# Patient Record
Sex: Female | Born: 1980
Health system: Southern US, Community
[De-identification: ages and names within clinical notes are randomized; demographics above are authoritative.]

## PROBLEM LIST (undated history)

## (undated) ENCOUNTER — Inpatient Hospital Stay (HOSPITAL_COMMUNITY): Payer: Self-pay

## (undated) DIAGNOSIS — R002 Palpitations: Secondary | ICD-10-CM

## (undated) DIAGNOSIS — M549 Dorsalgia, unspecified: Secondary | ICD-10-CM

## (undated) DIAGNOSIS — R600 Localized edema: Secondary | ICD-10-CM

## (undated) DIAGNOSIS — R0602 Shortness of breath: Secondary | ICD-10-CM

## (undated) DIAGNOSIS — K5909 Other constipation: Secondary | ICD-10-CM

## (undated) DIAGNOSIS — O09519 Supervision of elderly primigravida, unspecified trimester: Secondary | ICD-10-CM

## (undated) DIAGNOSIS — E559 Vitamin D deficiency, unspecified: Secondary | ICD-10-CM

## (undated) DIAGNOSIS — E538 Deficiency of other specified B group vitamins: Secondary | ICD-10-CM

## (undated) DIAGNOSIS — R5383 Other fatigue: Secondary | ICD-10-CM

## (undated) DIAGNOSIS — F32A Depression, unspecified: Secondary | ICD-10-CM

## (undated) DIAGNOSIS — Z9882 Breast implant status: Secondary | ICD-10-CM

## (undated) DIAGNOSIS — K5900 Constipation, unspecified: Secondary | ICD-10-CM

## (undated) DIAGNOSIS — N83201 Unspecified ovarian cyst, right side: Secondary | ICD-10-CM

## (undated) DIAGNOSIS — D649 Anemia, unspecified: Secondary | ICD-10-CM

## (undated) DIAGNOSIS — N809 Endometriosis, unspecified: Secondary | ICD-10-CM

## (undated) DIAGNOSIS — F419 Anxiety disorder, unspecified: Secondary | ICD-10-CM

## (undated) HISTORY — DX: Vitamin D deficiency, unspecified: E55.9

## (undated) HISTORY — DX: Shortness of breath: R06.02

## (undated) HISTORY — DX: Other fatigue: R53.83

## (undated) HISTORY — DX: Endometriosis, unspecified: N80.9

## (undated) HISTORY — PX: AUGMENTATION MAMMAPLASTY: SUR837

## (undated) HISTORY — DX: Anxiety disorder, unspecified: F41.9

## (undated) HISTORY — DX: Dorsalgia, unspecified: M54.9

## (undated) HISTORY — DX: Deficiency of other specified B group vitamins: E53.8

## (undated) HISTORY — DX: Constipation, unspecified: K59.00

## (undated) HISTORY — DX: Anemia, unspecified: D64.9

## (undated) HISTORY — PX: BREAST IMPLANT REMOVAL: SUR1101

## (undated) HISTORY — DX: Localized edema: R60.0

## (undated) HISTORY — DX: Palpitations: R00.2

## (undated) HISTORY — DX: Depression, unspecified: F32.A

## (undated) HISTORY — PX: REDUCTION MAMMAPLASTY: SUR839

---
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
## 2005-07-20 DIAGNOSIS — Z9882 Breast implant status: Secondary | ICD-10-CM

## 2005-07-20 HISTORY — DX: Breast implant status: Z98.82

## 2017-06-08 ENCOUNTER — Encounter: Payer: Self-pay | Admitting: Obstetrics and Gynecology

## 2017-08-26 ENCOUNTER — Other Ambulatory Visit: Payer: Self-pay

## 2017-08-26 ENCOUNTER — Ambulatory Visit: Payer: BLUE CROSS/BLUE SHIELD | Admitting: Obstetrics and Gynecology

## 2017-08-26 ENCOUNTER — Encounter: Payer: Self-pay | Admitting: Obstetrics and Gynecology

## 2017-08-26 VITALS — BP 108/62 | HR 66 | Resp 18 | Ht 66.0 in | Wt 157.0 lb

## 2017-08-26 DIAGNOSIS — N83201 Unspecified ovarian cyst, right side: Secondary | ICD-10-CM

## 2017-08-26 DIAGNOSIS — M79604 Pain in right leg: Secondary | ICD-10-CM

## 2017-08-26 DIAGNOSIS — M545 Low back pain: Secondary | ICD-10-CM

## 2017-08-26 NOTE — Progress Notes (Signed)
Scheduled patient while in office for PUS with Dr.Silva on 09/02/2017 at 9:30 am with 10 am consult. Patient is agreeable to date and time.

## 2017-08-26 NOTE — Progress Notes (Signed)
37 y.o. G0P0000 Married Caucasian/[place]ian female here for lower back pain and to establish care. Patient has been told she has endometriosis but has not had diagnostic testing   Began right lumbar pain close to menstrual period in 2016.  Had Mirena placed in 2016 and felt like the pain worsened.  Did an ultrasound in [place] 2018 which showed 27 mm hemorrhagic corpus luteum cyst of the right ovary.  IUD was in a good position.  Had a laparoscopic surgery scheduled for November 2018 but did not proceed due to her recent wedding and plans to move to the [place].  Has not done an evaluation of the lower spine.  Exercise can provoke the pain.  Larey Seat down her stairs at home 10 years ago. No recent trauma.   Feels bloated.   Menses are monthly and very light.  Last 4 days.   No dysuria or hematuria.  2 UTIs after IUD placed.   No pain with bowel movement or blood in the stool. No black stool.  Considering future childbearing.   Recently married.  Architect.   PCP:   None.   Patient's last menstrual period was 08/17/2017 (exact date).     Period Cycle (Days): (only occ. spotting with Mirena IUD)     Sexually active: Yes.    The current method of family planning is IUD.   Placed Oct. 2016.  Exercising: No.   Smoker:  no  Health Maintenance: Pap:  07/2017 normal per patient. History of abnormal Pap:  no MMG:  12/2016 ultrasound in [place]--normal.  Had a known nodule of the right breast that is being monitored.  Colonoscopy:  n/a BMD:   n/a  Result  n/a TDaP:  unsure Gardasil:   unsure HIV: Neg Hep C:Neg   reports that  has never smoked. she has never used smokeless tobacco. She reports that she drinks about 1.2 oz of alcohol per week. She reports that she does not use drugs.  History reviewed. No pertinent past medical history.  Past Surgical History:  Procedure Laterality Date  . AUGMENTATION MAMMAPLASTY     Silicone implants--[place]    No current outpatient medications  on file.   No current facility-administered medications for this visit.     Family History  Problem Relation Age of Onset  . Diabetes Paternal Grandmother     ROS:  Pertinent items are noted in HPI.  Otherwise, a comprehensive ROS was negative.  Exam:   BP 108/62 (BP Location: Right Arm, Patient Position: Sitting, Cuff Size: Normal)   Pulse 66   Resp 18   Ht 5\' 6"  (1.676 m)   Wt 157 lb (71.2 kg)   LMP 08/17/2017 (Exact Date)   BMI 25.34 kg/m     General appearance: alert, cooperative and appears stated age Head: Normocephalic, without obvious abnormality, atraumatic Neck: no adenopathy, supple, symmetrical, trachea midline and thyroid normal to inspection and palpation Lungs: clear to auscultation bilaterally Heart: regular rate and rhythm Abdomen: soft, non-tender; no masses, no organomegaly Extremities: extremities normal, atraumatic, no cyanosis or edema Skin: Skin color, texture, turgor normal. No rashes or lesions No abnormal inguinal nodes palpated Neurologic: Grossly normal  Pelvic: External genitalia:  no lesions              Urethra:  normal appearing urethra with no masses, tenderness or lesions              Bartholins and Skenes: normal  Vagina: normal appearing vagina with normal color and discharge, no lesions              Cervix: no lesions.  IUD strings noted.  No CMT.             Bimanual Exam:  Uterus:  normal size, contour, position, consistency, mobility, non-tender              Adnexa: no mass, fullness, tenderness.  Right ovary slightly larger than left ovary.  Nontender.              Rectal exam: Yes.  .  Confirms.              Anus:  normal sphincter tone, no lesions  Chaperone was present for exam.  Assessment:   Right lumbar pain with sciatica, perimenstrual. Right ovarian cyst on ultrasound.   Plan:   Discussion of endometriosis, fertility, and possible surgical intervention.  I discussed AMA status.  Return for pelvic  ultrasound.  Continue with Mirena.  I am recommending she also establish care with PCP for eval of lumbar pain with sciatica.  Dr. Leola [place] Aguiar.    After visit summary provided.   ___45____ minutes face to face time of which over 50% was spent in counseling.

## 2017-08-26 NOTE — Patient Instructions (Signed)
Consider Dr. Bernadette Hoitafael[place] [facility] as a new family doctor.

## 2017-08-30 ENCOUNTER — Telehealth: Payer: Self-pay | Admitting: Obstetrics and Gynecology

## 2017-08-30 NOTE — Telephone Encounter (Signed)
Spoke with patients spouse Rulon SeraLeandro, ok per current dpr, requesting to reschedule PUS to a later time. PUS rescheduled for 09/02/17 at 10:30am with consult at 11am with Dr. Edward JollySilva. Spouse is agreeable to date and time.   Routing to provider for final review.  Will close encounter.  Cc: Harland DingwallSuzy Dixon

## 2017-08-30 NOTE — Telephone Encounter (Signed)
Patient's spouse Rulon SeraLeandro is calling  to reschedule his wife's pus appointment. D[facility] on fie to [facility] with [facility].

## 2017-09-02 ENCOUNTER — Encounter: Payer: Self-pay | Admitting: Obstetrics and Gynecology

## 2017-09-02 ENCOUNTER — Ambulatory Visit (INDEPENDENT_AMBULATORY_CARE_PROVIDER_SITE_OTHER): Payer: BLUE CROSS/BLUE SHIELD

## 2017-09-02 ENCOUNTER — Ambulatory Visit: Payer: BLUE CROSS/BLUE SHIELD | Admitting: Obstetrics and Gynecology

## 2017-09-02 ENCOUNTER — Other Ambulatory Visit: Payer: BLUE CROSS/BLUE SHIELD | Admitting: Obstetrics and Gynecology

## 2017-09-02 ENCOUNTER — Other Ambulatory Visit: Payer: BLUE CROSS/BLUE SHIELD

## 2017-09-02 VITALS — BP 114/66 | HR 70 | Ht 66.0 in | Wt 157.0 lb

## 2017-09-02 DIAGNOSIS — N83202 Unspecified ovarian cyst, left side: Secondary | ICD-10-CM | POA: Diagnosis not present

## 2017-09-02 DIAGNOSIS — R102 Pelvic and perineal pain: Secondary | ICD-10-CM | POA: Diagnosis not present

## 2017-09-02 DIAGNOSIS — M545 Low back pain, unspecified: Secondary | ICD-10-CM

## 2017-09-02 DIAGNOSIS — N83201 Unspecified ovarian cyst, right side: Secondary | ICD-10-CM

## 2017-09-02 DIAGNOSIS — Z87898 Personal history of other specified conditions: Secondary | ICD-10-CM

## 2017-09-02 DIAGNOSIS — M79604 Pain in right leg: Secondary | ICD-10-CM

## 2017-09-02 MED ORDER — NORETHINDRONE 0.35 MG PO TABS: 1.0000 | ORAL_TABLET | Freq: Every day | ORAL | 0 refills | Status: DC

## 2017-09-02 NOTE — Progress Notes (Signed)
GYNECOLOGY  VISIT   HPI: 37 y.o.   Married  Caucasian/[place]ian  female   G0P0000 with Patient's last menstrual period was 08/17/2017 (exact date).   here for pelvic ultrasound for Rt.ovarian cyst follow up.   Husband present for the visit today.   Has lumbar pain and extends down the back of right leg during her cycle. Has cramping.   Prior US in [place] showing a hemorrhagic right ovarian cyst.  Has Mirena IUD.  Stopped using combined oral contraception for reasons of edema.  This continued after she stopped combined OCPs. States she did not do well with [facility] in [place] which was a 30 mcg estradiol pill. Did better with a 20 mcg estradiol pill.  Nonsmoker, no HTN, No liver disease, No DVT/PE or family history of this, no hx migraine HAs.  No PCP visit to date.  GYNECOLOGIC HISTORY: Patient's last menstrual period was 08/17/2017 (exact date). Contraception:  Mirena 04/2015 Menopausal hormone therapy:  none Last mammogram:  12/2016 ultrasound in [place]--normal.  Had a known nodule of the right breast that is being monitored.  Last pap smear:   07/2017 normal per patient.        OB History    Gravida Para Term Preterm AB Living   0 0 0 0 0 0   SAB TAB Ectopic Multiple Live Births   0 0 0 0 0         There are no active problems to display for this patient.   No past medical history on file.  Past Surgical History:  Procedure Laterality Date  . AUGMENTATION MAMMAPLASTY     Silicone implants--[place]    No current outpatient medications on file.   No current facility-administered medications for this visit.      ALLERGIES: Patient has no known allergies.  Family History  Problem Relation Age of Onset  . Diabetes Paternal Grandmother     Social History   Socioeconomic History  . Marital status: Married    Spouse name: Not on file  . Number of children: Not on file  . Years of education: Not on file  . Highest education level: Not on file  Social Needs   . Financial resource strain: Not on file  . Food insecurity - worry: Not on file  . Food insecurity - inability: Not on file  . Transportation needs - medical: Not on file  . Transportation needs - non-medical: Not on file  Occupational History  . Not on file  Tobacco Use  . Smoking status: Never Smoker  . Smokeless tobacco: Never Used  Substance and Sexual Activity  . Alcohol use: Yes    Alcohol/week: 1.2 oz    Types: 2 Glasses of wine per week  . Drug use: No  . Sexual activity: Yes    Birth control/protection: IUD    Comment: Mirena inseted 04/2015  Other Topics Concern  . Not on file  Social History Narrative  . Not on file    ROS:  Pertinent items are noted in HPI.  PHYSICAL EXAMINATION:    BP 114/66 (BP Location: Left Arm, Patient Position: Sitting, Cuff Size: Normal)   Pulse 70   Ht 5\' 6"  (1.676 m)   Wt 157 lb (71.2 kg)   LMP 08/17/2017 (Exact Date)   BMI 25.34 kg/m     General appearance: alert, cooperative and appears stated age  Pelvic US: Uterus with no masses.  IUD in endometrial canal. EMS 3.47 mm.  Right ovary with  16 mm follicle with calcificaitons.  Left ovary with 46 x 34 x 44 mm hemorrhagic cyst.   No free fluid.  ASSESSMENT  Lumbar/pelvic pain.  Left ovarian cyst.  Mirena IUD.  Right breast nodule.  Bilateral breast implants.    PLAN  Discussion of pelvic pain and options for treatment:  COCs or POPs, [place], Depo Lupron, laparoscopy.  Will proceed with Micronor for 3 month trial.  Discussed risks and benefits. Follow up of left ovarian cyst in 6 weeks.  Schedule bilateral dx mammogram and right breast US.   An After Visit Summary was printed and given to the patient.  __25___ minutes face to face time of which over 50% was spent in counseling.

## 2017-09-02 NOTE — Patient Instructions (Signed)
Norethindrone tablets (contraception) What is this medicine? NORETHINDRONE (nor eth IN drone) is an oral contraceptive. The product contains a female hormone known as a progestin. It is used to prevent pregnancy. This medicine may be used for other purposes; ask your health care provider or pharmacist if you have questions. COMMON BRAND NAME(S): Camila, Deblitane 28-Day, Errin, Heather, Jencycla, Jolivette, Lyza, Nor-QD, Nora-BE, Norlyroc, Ortho Micronor, Sharobel 28-Day What should I tell my health care provider before I take this medicine? They need to know if you have any of these conditions: -blood vessel disease or blood clots -breast, cervical, or vaginal cancer -diabetes -heart disease -kidney disease -liver disease -mental depression -migraine -seizures -stroke -vaginal bleeding -an unusual or allergic reaction to norethindrone, other medicines, foods, dyes, or preservatives -pregnant or trying to get pregnant -breast-feeding How should I use this medicine? Take this medicine by mouth with a glass of water. You may take it with or without food. Follow the directions on the prescription label. Take this medicine at the same time each day and in the order directed on the package. Do not take your medicine more often than directed. Contact your pediatrician regarding the use of this medicine in children. Special care may be needed. This medicine has been used in female children who have started having menstrual periods. A patient package insert for the product will be given with each prescription and refill. Read this sheet carefully each time. The sheet may change frequently. Overdosage: If you think you have taken too much of this medicine contact a poison control center or emergency room at once. NOTE: This medicine is only for you. Do not share this medicine with others. What if I miss a dose? Try not to miss a dose. Every time you miss a dose or take a dose late your chance of  pregnancy increases. When 1 pill is missed (even if only 3 hours late), take the missed pill as soon as possible and continue taking a pill each day at the regular time (use a back up method of birth control for the next 48 hours). If more than 1 dose is missed, use an additional birth control method for the rest of your pill pack until menses occurs. Contact your health care professional if more than 1 dose has been missed. What may interact with this medicine? Do not take this medicine with any of the following medications: -amprenavir or fosamprenavir -bosentan This medicine may also interact with the following medications: -antibiotics or medicines for infections, especially rifampin, rifabutin, rifapentine, and griseofulvin, and possibly penicillins or tetracyclines -aprepitant -barbiturate medicines, such as phenobarbital -carbamazepine -felbamate -modafinil -oxcarbazepine -phenytoin -ritonavir or other medicines for HIV infection or AIDS -St. John's wort -topiramate This list may not describe all possible interactions. Give your health care provider a list of all the medicines, herbs, non-prescription drugs, or dietary supplements you use. Also tell them if you smoke, drink alcohol, or use illegal drugs. Some items may interact with your medicine. What should I watch for while using this medicine? Visit your doctor or health care professional for regular checks on your progress. You will need a regular breast and pelvic exam and Pap smear while on this medicine. Use an additional method of birth control during the first cycle that you take these tablets. If you have any reason to think you are pregnant, stop taking this medicine right away and contact your doctor or health care professional. If you are taking this medicine for hormone related problems, it   may take several cycles of use to see improvement in your condition. This medicine does not protect you against HIV infection (AIDS)  or any other sexually transmitted diseases. What side effects may I notice from receiving this medicine? Side effects that you should report to your doctor or health care professional as soon as possible: -breast tenderness or discharge -pain in the abdomen, chest, groin or leg -severe headache -skin rash, itching, or hives -sudden shortness of breath -unusually weak or tired -vision or speech problems -yellowing of skin or eyes Side effects that usually do not require medical attention (report to your doctor or health care professional if they continue or are bothersome): -changes in sexual desire -change in menstrual flow -facial hair growth -fluid retention and swelling -headache -irritability -nausea -weight gain or loss This list may not describe all possible side effects. Call your doctor for medical advice about side effects. You may report side effects to FDA at 1-800-FDA-1088. Where should I keep my medicine? Keep out of the reach of children. Store at room temperature between 15 and 30 degrees C (59 and 86 degrees F). Throw away any unused medicine after the expiration date. NOTE: This sheet is a summary. It may not cover all possible information. If you have questions about this medicine, talk to your doctor, pharmacist, or health care provider.  2018 Elsevier/Gold Standard (2012-03-25 16:41:35)  

## 2017-09-02 NOTE — Progress Notes (Signed)
Encounter reviewed by Dr. Keniesha Adderly Amundson C. Silva.  

## 2017-09-02 NOTE — Progress Notes (Signed)
Scheduled patient while in office for bilateral diagnostic mammogram with right breast ultrasound on 09/30/2017 at 3:[facility]. Patient is agreeable to date and time. Placed in mammogram hold. Patient scheduled for 3 month follow up PUS on 12/09/2017 at 2 pm with 2:30 pm consult with Dr.Silva.

## 2017-09-30 ENCOUNTER — Ambulatory Visit
Admission: RE | Admit: 2017-09-30 | Discharge: 2017-09-30 | Disposition: A | Discharge status: Home / Self Care | Payer: BLUE CROSS/BLUE SHIELD | Source: Ambulatory Visit | Attending: Obstetrics and Gynecology | Admitting: Obstetrics and Gynecology

## 2017-09-30 ENCOUNTER — Other Ambulatory Visit: Payer: Self-pay | Admitting: Obstetrics and Gynecology

## 2017-09-30 DIAGNOSIS — N6310 Unspecified lump in the right breast, unspecified quadrant: Secondary | ICD-10-CM | POA: Diagnosis not present

## 2017-09-30 DIAGNOSIS — Z87898 Personal history of other specified conditions: Secondary | ICD-10-CM

## 2017-09-30 DIAGNOSIS — N6002 Solitary cyst of left breast: Secondary | ICD-10-CM

## 2017-09-30 DIAGNOSIS — R922 Inconclusive mammogram: Secondary | ICD-10-CM | POA: Diagnosis not present

## 2017-09-30 DIAGNOSIS — N6320 Unspecified lump in the left breast, unspecified quadrant: Secondary | ICD-10-CM | POA: Diagnosis not present

## 2017-10-06 ENCOUNTER — Other Ambulatory Visit: Payer: Self-pay | Admitting: Obstetrics and Gynecology

## 2017-10-06 DIAGNOSIS — N6310 Unspecified lump in the right breast, unspecified quadrant: Secondary | ICD-10-CM

## 2017-10-06 DIAGNOSIS — N6489 Other specified disorders of breast: Secondary | ICD-10-CM

## 2017-11-22 ENCOUNTER — Encounter: Payer: Self-pay | Admitting: Obstetrics and Gynecology

## 2017-11-22 ENCOUNTER — Other Ambulatory Visit: Payer: Self-pay

## 2017-11-22 ENCOUNTER — Ambulatory Visit: Payer: BLUE CROSS/BLUE SHIELD | Admitting: Obstetrics and Gynecology

## 2017-11-22 VITALS — BP 110/60 | HR 76 | Resp 16 | Ht 66.0 in | Wt 161.0 lb

## 2017-11-22 DIAGNOSIS — N83202 Unspecified ovarian cyst, left side: Secondary | ICD-10-CM | POA: Diagnosis not present

## 2017-11-22 DIAGNOSIS — R102 Pelvic and perineal pain: Secondary | ICD-10-CM | POA: Diagnosis not present

## 2017-11-22 MED ORDER — NORETHINDRONE 0.35 MG PO TABS: 1.0000 | ORAL_TABLET | Freq: Every day | ORAL | 2 refills | Status: DC

## 2017-11-22 NOTE — Progress Notes (Signed)
GYNECOLOGY  VISIT   HPI: 37 y.o.   Married  [place]/Caucasian  female   G0P0000 with No LMP recorded (lmp unknown).   here for medication f/u.  Hx bilateral ovarian cysts.  Pelvic US on 09/02/17 showing hemorrhagic left ovarian cyst.  Previously had hemorrhagic right ovarian cyst in [place].  Has Mirena IUD.  I added Micronor for 3 month trial.  Much less cramping.  States she is much better.  A little acne for the last month.  Feels more sensitive emotionally.   Would like to take out Mirena IUD in December when she would like to try for pregnancy.   GYNECOLOGIC HISTORY: No LMP recorded (lmp unknown). Contraception:  Mirena IUD inserted 04/2015 Menopausal hormone therapy:  none Last mammogram:  12/2016 ultrasound in [place] -- normal. Had a known nodule of the right breast that is being monitored. Last pap smear:   07/2017 normal per patient        OB History    Gravida  0   Para  0   Term  0   Preterm  0   AB  0   Living  0     SAB  0   TAB  0   Ectopic  0   Multiple  0   Live Births  0              Patient Active Problem List   Diagnosis Date Noted  . Left ovarian cyst 09/02/2017    No past medical history on file.  Past Surgical History:  Procedure Laterality Date  . AUGMENTATION MAMMAPLASTY     Silicone implants--[place]    Current Outpatient Medications  Medication Sig Dispense Refill  . levonorgestrel (MIRENA) 20 MCG/24HR IUD 1 each by Intrauterine route once.    . norethindrone (MICRONOR,CAMILA,ERRIN) 0.35 MG tablet Take 1 tablet (0.35 mg total) by mouth daily. 3 Package 0   No current facility-administered medications for this visit.      ALLERGIES: Patient has no known allergies.  Family History  Problem Relation Age of Onset  . Diabetes Paternal Grandmother     Social History   Socioeconomic History  . Marital status: Married    Spouse name: Not on file  . Number of children: Not on file  . Years of education: Not on  file  . Highest education level: Not on file  Occupational History  . Not on file  Social Needs  . Financial resource strain: Not on file  . Food insecurity:    Worry: Not on file    Inability: Not on file  . Transportation needs:    Medical: Not on file    Non-medical: Not on file  Tobacco Use  . Smoking status: Never Smoker  . Smokeless tobacco: Never Used  Substance and Sexual Activity  . Alcohol use: Yes    Alcohol/week: 1.2 oz    Types: 2 Glasses of wine per week  . Drug use: No  . Sexual activity: Yes    Birth control/protection: IUD    Comment: Mirena inseted 04/2015  Lifestyle  . Physical activity:    Days per week: Not on file    Minutes per session: Not on file  . Stress: Not on file  Relationships  . Social connections:    Talks on phone: Not on file    Gets together: Not on file    Attends religious service: Not on file    Active member of club or organization: Not on  file    Attends meetings of clubs or organizations: Not on file    Relationship status: Not on file  . Intimate partner violence:    Fear of current or ex partner: Not on file    Emotionally abused: Not on file    Physically abused: Not on file    Forced sexual activity: Not on file  Other Topics Concern  . Not on file  Social History Narrative  . Not on file    Review of Systems  Constitutional: Negative.   HENT: Negative.   Eyes: Negative.   Respiratory: Negative.   Cardiovascular: Negative.   Gastrointestinal: Negative.   Endocrine: Negative.   Genitourinary: Negative.   Musculoskeletal: Negative.   Skin: Negative.   Allergic/Immunologic: Negative.   Neurological: Negative.   Hematological: Negative.   Psychiatric/Behavioral: Negative.     PHYSICAL EXAMINATION:    BP 110/60 (BP Location: Right Arm, Patient Position: Sitting, Cuff Size: Normal)   Pulse 76   Resp 16   Ht  (1.676 m)   Wt 161 lb (73 kg)   LMP  (LMP Unknown)   BMI 25.99 kg/m     General appearance:  alert, cooperative and appears stated age   ASSESSMENT  Pelvic pain improved.  On Micronor and has Mirena IUD.  Hemorrhagic left ovarian cyst.   PLAN  We will continue with her Micronor and Mirena until December so she has good pain relief and good pregnancy prevention.  Follow up pelvic ultrasound scheduled for this month.  IUD removal at the end of the year.   An After Visit Summary was printed and given to the patient.  __15____ minutes face to face time of which over 50% was spent in counseling.

## 2017-12-09 ENCOUNTER — Ambulatory Visit: Payer: BLUE CROSS/BLUE SHIELD | Admitting: Obstetrics and Gynecology

## 2017-12-09 ENCOUNTER — Ambulatory Visit (INDEPENDENT_AMBULATORY_CARE_PROVIDER_SITE_OTHER): Payer: BLUE CROSS/BLUE SHIELD

## 2017-12-09 ENCOUNTER — Other Ambulatory Visit: Payer: Self-pay

## 2017-12-09 ENCOUNTER — Encounter: Payer: Self-pay | Admitting: Obstetrics and Gynecology

## 2017-12-09 VITALS — BP 110/66 | HR 76 | Resp 16 | Ht 66.0 in | Wt 162.0 lb

## 2017-12-09 DIAGNOSIS — Z3169 Encounter for other general counseling and advice on procreation: Secondary | ICD-10-CM | POA: Diagnosis not present

## 2017-12-09 DIAGNOSIS — N83201 Unspecified ovarian cyst, right side: Secondary | ICD-10-CM | POA: Diagnosis not present

## 2017-12-09 DIAGNOSIS — N83202 Unspecified ovarian cyst, left side: Secondary | ICD-10-CM | POA: Diagnosis not present

## 2017-12-09 NOTE — Patient Instructions (Addendum)
Try the book What to Expect When Trying for Pregnancy.   Ovarian Cyst An ovarian cyst is a fluid-filled sac that forms on an ovary. The ovaries are small organs that produce eggs in women. Various types of cysts can form on the ovaries. Some may cause symptoms and require treatment. Most ovarian cysts go away on their own, are not cancerous (are benign), and do not cause problems. Common types of ovarian cysts include:  Functional (follicle) cysts. ? Occur during the menstrual cycle, and usually go away with the next menstrual cycle if you do not get pregnant. ? Usually cause no symptoms.  Endometriomas. ? Are cysts that form from the tissue that lines the uterus (endometrium). ? Are sometimes called "chocolate cysts" because they become filled with blood that turns brown. ? Can cause pain in the lower abdomen during intercourse and during your period.  Cystadenoma cysts. ? Develop from cells on the outside surface of the ovary. ? Can get very large and cause lower abdomen pain and pain with intercourse. ? Can cause severe pain if they twist or break open (rupture).  Dermoid cysts. ? Are sometimes found in both ovaries. ? May contain different kinds of body tissue, such as skin, teeth, hair, or cartilage. ? Usually do not cause symptoms unless they get very big.  Theca lutein cysts. ? Occur when too much of a certain hormone (human chorionic gonadotropin) is produced and overstimulates the ovaries to produce an egg. ? Are most common after having procedures used to assist with the conception of a baby (in vitro fertilization).  What are the causes? Ovarian cysts may be caused by:  Ovarian hyperstimulation syndrome. This is a condition that can develop from taking fertility medicines. It causes multiple large ovarian cysts to form.  Polycystic ovarian syndrome (PCOS). This is a common hormonal disorder that can cause ovarian cysts, as well as problems with your period or  fertility.  What increases the risk? The following factors may make you more likely to develop ovarian cysts:  Being overweight or obese.  Taking fertility medicines.  Taking certain forms of hormonal birth control.  Smoking.  What are the signs or symptoms? Many ovarian cysts do not cause symptoms. If symptoms are present, they may include:  Pelvic pain or pressure.  Pain in the lower abdomen.  Pain during sex.  Abdominal swelling.  Abnormal menstrual periods.  Increasing pain with menstrual periods.  How is this diagnosed? These cysts are commonly found during a routine pelvic exam. You may have tests to find out more about the cyst, such as:  Ultrasound.  X-ray of the pelvis.  CT scan.  MRI.  Blood tests.  How is this treated? Many ovarian cysts go away on their own without treatment. Your health care provider may want to check your cyst regularly for 2-3 months to see if it changes. If you are in menopause, it is especially important to have your cyst monitored closely because menopausal women have a higher rate of ovarian cancer. When treatment is needed, it may include:  Medicines to help relieve pain.  A procedure to drain the cyst (aspiration).  Surgery to remove the whole cyst.  Hormone treatment or birth control pills. These methods are sometimes used to help dissolve a cyst.  Follow these instructions at home:  Take over-the-counter and prescription medicines only as told by your health care provider.  Do not drive or use heavy machinery while taking prescription pain medicine.  Get regular pelvic  exams and Pap tests as often as told by your health care provider.  Return to your normal activities as told by your health care provider. Ask your health care provider what activities are safe for you.  Do not use any products that contain nicotine or tobacco, such as cigarettes and e-cigarettes. If you need help quitting, ask your health care  provider.  Keep all follow-up visits as told by your health care provider. This is important. Contact a health care provider if:  Your periods are late, irregular, or painful, or they stop.  You have pelvic pain that does not go away.  You have pressure on your bladder or trouble emptying your bladder completely.  You have pain during sex.  You have any of the following in your abdomen: ? A feeling of fullness. ? Pressure. ? Discomfort. ? Pain that does not go away. ? Swelling.  You feel generally ill.  You become constipated.  You lose your appetite.  You develop severe acne.  You start to have more body hair and facial hair.  You are gaining weight or losing weight without changing your exercise and eating habits.  You think you may be pregnant. Get help right away if:  You have abdominal pain that is severe or gets worse.  You cannot eat or drink without vomiting.  You suddenly develop a fever.  Your menstrual period is much heavier than usual. This information is not intended to replace advice given to you by your health care provider. Make sure you discuss any questions you have with your health care provider. Document Released: 07/06/2005 Document Revised: 01/24/2016 Document Reviewed: 12/08/2015 Elsevier Interactive Patient Education  [employer].

## 2017-12-09 NOTE — Progress Notes (Signed)
GYNECOLOGY  VISIT   HPI: 37 y.o.   Married  [place]  female   G0P0000 with Patient's last menstrual period was 11/18/2017.   here for ultrasound recheck of ovaries.  Still has some right lower abdominal pain.   Hx ovarian cysts.  Prior US in [place] showed a hemorrhagic right ovarian cyst.  [place] 09/02/17 here had left ovary with 46 x 34 x 44 mm echogenic cyst with debris consistent with hemorrhagic cyst.  Right ovary with follicle and small calcifications.  IUD in endometrial canal. No free fluid.  On Micronor and Mirena IUD until December when she want to try for pregnancy.  Forgot a pill this month, and had pain.   Thinking about pursing childbearing more in the future and not at the end of this year.  Considering professional goals.  GYNECOLOGIC HISTORY: Patient's last menstrual period was 11/18/2017. Contraception:  Mirena IUD inserted 04/2015 Menopausal hormone therapy:  none Last mammogram:  09/30/17 Bilateral MM/Right and Left [place] -- BIRADS 3:Probably benign/density c Last pap smear:   07/2017 normal per patient        OB History    Gravida  0   Para  0   Term  0   Preterm  0   AB  0   Living  0     SAB  0   TAB  0   Ectopic  0   Multiple  0   Live Births  0              Patient Active Problem List   Diagnosis Date Noted  . Left ovarian cyst 09/02/2017    History reviewed. No pertinent past medical history.  Past Surgical History:  Procedure Laterality Date  . AUGMENTATION MAMMAPLASTY     Silicone implants--[place]    Current Outpatient Medications  Medication Sig Dispense Refill  . levonorgestrel (MIRENA) 20 MCG/24HR IUD 1 each by Intrauterine route once.    . norethindrone (MICRONOR,CAMILA,ERRIN) 0.35 MG tablet Take 1 tablet (0.35 mg total) by mouth daily. 3 Package 2   No current facility-administered medications for this visit.      ALLERGIES: Patient has no known allergies.  Family History  Problem Relation Age of Onset   . Diabetes Paternal Grandmother     Social History   Socioeconomic History  . Marital status: Married    Spouse name: Not on file  . Number of children: Not on file  . Years of education: Not on file  . Highest education level: Not on file  Occupational History  . Not on file  Social Needs  . Financial resource strain: Not on file  . Food insecurity:    Worry: Not on file    Inability: Not on file  . Transportation needs:    Medical: Not on file    Non-medical: Not on file  Tobacco Use  . Smoking status: Never Smoker  . Smokeless tobacco: Never Used  Substance and Sexual Activity  . Alcohol use: Yes    Alcohol/week: 1.2 oz    Types: 2 Glasses of wine per week  . Drug use: No  . Sexual activity: Yes    Birth control/protection: IUD, Pill    Comment: Mirena inseted 04/2015  Lifestyle  . Physical activity:    Days per week: Not on file    Minutes per session: Not on file  . Stress: Not on file  Relationships  . Social connections:    Talks  on phone: Not on file    Gets together: Not on file    Attends religious service: Not on file    Active member of club or organization: Not on file    Attends meetings of clubs or organizations: Not on file    Relationship status: Not on file  . Intimate partner violence:    Fear of current or ex partner: Not on file    Emotionally abused: Not on file    Physically abused: Not on file    Forced sexual activity: Not on file  Other Topics Concern  . Not on file  Social History Narrative  . Not on file    Review of Systems  Constitutional: Negative.   HENT: Negative.   Eyes: Negative.   Respiratory: Negative.   Cardiovascular: Negative.   Gastrointestinal: Negative.   Endocrine: Negative.   Genitourinary: Negative.   Musculoskeletal: Negative.   Skin: Negative.   Allergic/Immunologic: Negative.   Neurological: Negative.   Hematological: Negative.   Psychiatric/Behavioral: Negative.     PHYSICAL EXAMINATION:     BP 110/66 (BP Location: Left Arm, Patient Position: Sitting, Cuff Size: Normal)   Pulse 76   Resp 16   Ht  (1.676 m)   Wt 162 lb (73.5 kg)   LMP 11/18/2017   BMI 26.15 kg/m     General appearance: alert, cooperative and appears stated age   Pelvic US Uterus normal.  IUD in endometrial canal. EMS 3.46 mm. Right ovary 30 mm thick walled ovarian cyst, echofree and no abnormal blood flow. Left ovary 31 mm thick walled ovarian cyst, echofree and no abnormal blood flow. No free fluid.  ASSESSMENT  Bilateral ovarian cysts.  Left cyst is getting smaller. Mirena IUD.  On POP for pelvic pain control. Preconception counseling.  AMA status.  PLAN  We discussed ovarian cysts.  She certainly has had several come and go over the last several months.  I do not recommend she have a routine scheduled ultrasound, but this can be done at any time for re-evaluation.  We discussed that laparoscopy for evaluation and treatment of pelvic pain and endometriosis is an option if she desires.  This was presented to her in [place] as well prior to her move here.  She wants to continue with the POP and Mirena. We talked about her advanced maternal age and the effects on pregnancy - decreased fertility, increased risk of miscarriage, increased risk of Down's syndrome, gestational diabetes, and hypertension in pregnancy.     An After Visit Summary was printed and given to the patient.  __15____ minutes face to face time of which over 50% was spent in counseling.

## 2017-12-12 DIAGNOSIS — N83201 Unspecified ovarian cyst, right side: Secondary | ICD-10-CM | POA: Insufficient documentation

## 2017-12-12 DIAGNOSIS — N83202 Unspecified ovarian cyst, left side: Principal | ICD-10-CM

## 2018-03-22 ENCOUNTER — Telehealth: Payer: Self-pay | Admitting: Obstetrics and Gynecology

## 2018-03-22 DIAGNOSIS — Z30432 Encounter for removal of intrauterine contraceptive device: Secondary | ICD-10-CM

## 2018-03-22 NOTE — Telephone Encounter (Signed)
Patient would like to talk about getting her Mirena removed.

## 2018-03-22 NOTE — Telephone Encounter (Signed)
Spoke with patient. Patient request to schedule IUD removal, Mirena placed 04/2015, planning for pregnancy. Patient declined consult, discussed at OV 12/09/17.   IUD removal scheduled for 04/07/18 at 2pm with Dr. Edward Jolly, order placed for precert.   Routing to provider for final review. Patient is agreeable to disposition. Will close encounter.   Cc: Harland Dingwall, Soundra Pilon

## 2018-04-07 ENCOUNTER — Ambulatory Visit (INDEPENDENT_AMBULATORY_CARE_PROVIDER_SITE_OTHER): Payer: BLUE CROSS/BLUE SHIELD | Admitting: Obstetrics and Gynecology

## 2018-04-07 ENCOUNTER — Encounter: Payer: Self-pay | Admitting: Obstetrics and Gynecology

## 2018-04-07 VITALS — BP 102/62 | HR 80 | Ht 66.0 in | Wt 165.0 lb

## 2018-04-07 DIAGNOSIS — Z3169 Encounter for other general counseling and advice on procreation: Secondary | ICD-10-CM

## 2018-04-07 DIAGNOSIS — Z30432 Encounter for removal of intrauterine contraceptive device: Secondary | ICD-10-CM

## 2018-04-07 NOTE — Patient Instructions (Signed)
Consider the book series "What to Expect When...Marland KitchenMarland Kitchen"  Prenatal Care WHAT IS PRENATAL CARE? Prenatal care is the process of caring for a pregnant woman before she gives birth. Prenatal care makes sure that she and her baby remain as healthy as possible throughout pregnancy. Prenatal care may be provided by a midwife, family practice health care provider, or a childbirth and pregnancy specialist (obstetrician). Prenatal care may include physical examinations, testing, treatments, and education on nutrition, lifestyle, and social support services. WHY IS PRENATAL CARE SO IMPORTANT? Early and consistent prenatal care increases the chance that you and your baby will remain healthy throughout your pregnancy. This type of care also decreases a baby's risk of being born too early (prematurely), or being born smaller than expected (small for gestational age). Any underlying medical conditions you may have that could pose a risk during your pregnancy are discussed during prenatal care visits. You will also be monitored regularly for any new conditions that may arise during your pregnancy so they can be treated quickly and effectively. WHAT HAPPENS DURING PRENATAL CARE VISITS? Prenatal care visits may include the following: Discussion Tell your health care provider about any new signs or symptoms you have experienced since your last visit. These might include:  Nausea or vomiting.  Increased or decreased level of energy.  Difficulty sleeping.  Back or leg pain.  Weight changes.  Frequent urination.  Shortness of breath with physical activity.  Changes in your skin, such as the development of a rash or itchiness.  Vaginal discharge or bleeding.  Feelings of excitement or nervousness.  Changes in your baby's movements.  You may want to write down any questions or topics you want to discuss with your health care provider and bring them with you to your appointment. Examination During your first  prenatal care visit, you will likely have a complete physical exam. Your health care provider will often examine your vagina, cervix, and the position of your uterus, as well as check your heart, lungs, and other body systems. As your pregnancy progresses, your health care provider will measure the size of your uterus and your baby's position inside your uterus. He or she may also examine you for early signs of labor. Your prenatal visits may also include checking your blood pressure and, after about 10-12 weeks of pregnancy, listening to your baby's heartbeat. Testing Regular testing often includes:  Urinalysis. This checks your urine for glucose, protein, or signs of infection.  Blood count. This checks the levels of white and red blood cells in your body.  Tests for sexually transmitted infections (STIs). Testing for STIs at the beginning of pregnancy is routinely done and is required in many states.  Antibody testing. You will be checked to see if you are immune to certain illnesses, such as rubella, that can affect a developing fetus.  Glucose screen. Around 24-28 weeks of pregnancy, your blood glucose level will be checked for signs of gestational diabetes. Follow-up tests may be recommended.  Group B strep. This is a bacteria that is commonly found inside a woman's vagina. This test will inform your health care provider if you need an antibiotic to reduce the amount of this bacteria in your body prior to labor and childbirth.  Ultrasound. Many pregnant women undergo an ultrasound screening around 18-20 weeks of pregnancy to evaluate the health of the fetus and check for any developmental abnormalities.  HIV (human immunodeficiency virus) testing. Early in your pregnancy, you will be screened for HIV. If you  are at high risk for HIV, this test may be repeated during your third trimester of pregnancy.  You may be offered other testing based on your age, personal or family medical history, or  other factors. HOW OFTEN SHOULD I PLAN TO SEE MY HEALTH CARE PROVIDER FOR PRENATAL CARE? Your prenatal care check-up schedule depends on any medical conditions you have before, or develop during, your pregnancy. If you do not have any underlying medical conditions, you will likely be seen for checkups:  Monthly, during the first 6 months of pregnancy.  Twice a month during months 7 and 8 of pregnancy.  Weekly starting in the 9th month of pregnancy and until delivery.  If you develop signs of early labor or other concerning signs or symptoms, you may need to see your health care provider more often. Ask your health care provider what prenatal care schedule is best for you. WHAT CAN I DO TO KEEP MYSELF AND MY BABY AS HEALTHY AS POSSIBLE DURING MY PREGNANCY?  Take a prenatal vitamin containing 400 micrograms (0.4 mg) of folic acid every day. Your health care provider may also ask you to take additional vitamins such as iodine, vitamin D, iron, copper, and zinc.  Take 1500-2000 mg of calcium daily starting at your 20th week of pregnancy until you deliver your baby.  Make sure you are up to date on your vaccinations. Unless directed otherwise by your health care provider: ? You should receive a tetanus, diphtheria, and pertussis (Tdap) vaccination between the 27th and 36th week of your pregnancy, regardless of when your last Tdap immunization occurred. This helps protect your baby from whooping cough (pertussis) after he or she is born. ? You should receive an annual inactivated influenza vaccine (IIV) to help protect you and your baby from influenza. This can be done at any point during your pregnancy.  Eat a well-rounded diet that includes: ? Fresh fruits and vegetables. ? Lean proteins. ? Calcium-rich foods such as milk, yogurt, hard cheeses, and dark, leafy greens. ? Whole grain breads.  Do noteat seafood high in mercury, including: ? Swordfish. ? Tilefish. ? Shark. ? King  mackerel. ? More than 6 oz tuna per week.  Do not eat: ? Raw or undercooked meats or eggs. ? Unpasteurized foods, such as soft cheeses (brie, blue, or feta), juices, and milks. ? Lunch meats. ? Hot dogs that have not been heated until they are steaming.  Drink enough water to keep your urine clear or pale yellow. For many women, this may be 10 or more 8 oz glasses of water each day. Keeping yourself hydrated helps deliver nutrients to your baby and may prevent the start of pre-term uterine contractions.  Do not use any tobacco products including cigarettes, chewing tobacco, or electronic cigarettes. If you need help quitting, ask your health care provider.  Do not drink beverages containing alcohol. No safe level of alcohol consumption during pregnancy has been determined.  Do not use any illegal drugs. These can harm your developing baby or cause a miscarriage.  Ask your health care provider or pharmacist before taking any prescription or over-the-counter medicines, herbs, or supplements.  Limit your caffeine intake to no more than 200 mg per day.  Exercise. Unless told otherwise by your health care provider, try to get 30 minutes of moderate exercise most days of the week. Do not  do high-impact activities, contact sports, or activities with a high risk of falling, such as horseback riding or downhill skiing.  Get  plenty of rest.  Avoid anything that raises your body temperature, such as hot tubs and saunas.  If you own a cat, do not empty its litter box. Bacteria contained in cat feces can cause an infection called toxoplasmosis. This can result in serious harm to the fetus.  Stay away from chemicals such as insecticides, lead, mercury, and cleaning or paint products that contain solvents.  Do not have any X-rays taken unless medically necessary.  Take a childbirth and breastfeeding preparation class. Ask your health care provider if you need a referral or recommendation.  This  information is not intended to replace advice given to you by your health care provider. Make sure you discuss any questions you have with your health care provider. Document Released: 07/09/2003 Document Revised: 12/09/2015 Document Reviewed: 09/20/2013 Elsevier Interactive Patient Education  2017 [employer].

## 2018-04-07 NOTE — Progress Notes (Signed)
GYNECOLOGY  VISIT   HPI: 37 y.o.   Married  [place]  female   G0P0000 with No LMP recorded. (Menstrual status: IUD).   here for IUD removal.   Has been on POP and using Mirena IUD.  Pain better with Mirena.  Not happy with weight gain.  Considering childbearing at the end of October.   She wants general health screening blood work today.  GYNECOLOGIC HISTORY: No LMP recorded. (Menstrual status: IUD). Contraception:  IUD Menopausal hormone therapy:  n/a Last mammogram:  09/30/2017 BI-RADS CATEGORY  3: Probably benign. Last pap smear:   07/2017 normal per patient        OB History    Gravida  0   Para  0   Term  0   Preterm  0   AB  0   Living  0     SAB  0   TAB  0   Ectopic  0   Multiple  0   Live Births  0              Patient Active Problem List   Diagnosis Date Noted  . Bilateral ovarian cysts 12/12/2017    No past medical history on file.  Past Surgical History:  Procedure Laterality Date  . AUGMENTATION MAMMAPLASTY     Silicone implants--[place]    Current Outpatient Medications  Medication Sig Dispense Refill  . levonorgestrel (MIRENA) 20 MCG/24HR IUD 1 each by Intrauterine route once.    . norethindrone (MICRONOR,CAMILA,ERRIN) 0.35 MG tablet Take 1 tablet (0.35 mg total) by mouth daily. 3 Package 2   No current facility-administered medications for this visit.      ALLERGIES: Patient has no known allergies.  Family History  Problem Relation Age of Onset  . Diabetes Paternal Grandmother     Social History   Socioeconomic History  . Marital status: Married    Spouse name: Not on file  . Number of children: Not on file  . Years of education: Not on file  . Highest education level: Not on file  Occupational History  . Not on file  Social Needs  . Financial resource strain: Not on file  . Food insecurity:    Worry: Not on file    Inability: Not on file  . Transportation needs:    Medical: Not on file    Non-medical: Not  on file  Tobacco Use  . Smoking status: Never Smoker  . Smokeless tobacco: Never Used  Substance and Sexual Activity  . Alcohol use: Yes    Alcohol/week: 2.0 standard drinks    Types: 2 Glasses of wine per week  . Drug use: No  . Sexual activity: Yes    Birth control/protection: IUD, Pill    Comment: Mirena inseted 04/2015  Lifestyle  . Physical activity:    Days per week: Not on file    Minutes per session: Not on file  . Stress: Not on file  Relationships  . Social connections:    Talks on phone: Not on file    Gets together: Not on file    Attends religious service: Not on file    Active member of club or organization: Not on file    Attends meetings of clubs or organizations: Not on file    Relationship status: Not on file  . Intimate partner violence:    Fear of current or ex partner: Not on file    Emotionally abused: Not on file    Physically  abused: Not on file    Forced sexual activity: Not on file  Other Topics Concern  . Not on file  Social History Narrative  . Not on file    Review of Systems  Constitutional: Negative.   HENT: Negative.   Eyes: Negative.   Respiratory: Negative.   Cardiovascular: Negative.   Gastrointestinal: Negative.   Endocrine: Negative.   Genitourinary: Negative.   Musculoskeletal: Negative.   Skin: Negative.   Allergic/Immunologic: Negative.   Neurological: Negative.   Hematological: Negative.   Psychiatric/Behavioral: Negative.   All other systems reviewed and are negative.   PHYSICAL EXAMINATION:    BP 102/62   Pulse 80   Ht 5\' 6"  (1.676 m)   Wt 165 lb (74.8 kg)   BMI 26.63 kg/m     General appearance: alert, cooperative and appears stated age    Pelvic: External genitalia:  no lesions              Urethra:  normal appearing urethra with no masses, tenderness or lesions              Bartholins and Skenes: normal                 Vagina: normal appearing vagina with normal color and discharge, no lesions               Cervix: no lesions                Bimanual Exam:  Uterus:  normal size, contour, position, consistency, mobility, non-tender              Adnexa: no mass, fullness, tenderness        MIrena IUD removal Consent for procedure. Ring for forceps used to grasp IUD strings. Mirena IUD removed intact, shown to patient and discarded.  Chaperone was present for exam.  ASSESSMENT  Mirena IUD patient.  Desire for future pregnancy.  AMA status.   PLAN  Start PNV.  Rubella screen.  Routine labs. Will continue Micronor for at least one more month.  We discussed her AMA status and increased risks of Downs Syndrome, difficulty achieving pregnancy, and increased risks for medical complications in pregnancy.  If no pregnancy achieved after 6 months of trying, she will return for fertility evaluation.    An After Visit Summary was printed and given to the patient.  _15_____ minutes face to face time of which over 50% was spent in counseling.

## 2018-04-08 LAB — COMPREHENSIVE METABOLIC PANEL
ALK PHOS: 53 IU/L (ref 39–117)
ALT: 8 IU/L (ref 0–32)
AST: 11 IU/L (ref 0–40)
Albumin/Globulin Ratio: 1.9 (ref 1.2–2.2)
Albumin: 4.7 g/dL (ref 3.5–5.5)
BILIRUBIN TOTAL: 0.4 mg/dL (ref 0.0–1.2)
BUN/Creatinine Ratio: 15 (ref 9–23)
BUN: 10 mg/dL (ref 6–20)
CHLORIDE: 102 mmol/L (ref 96–106)
CO2: 22 mmol/L (ref 20–29)
CREATININE: 0.65 mg/dL (ref 0.57–1.00)
Calcium: 9.5 mg/dL (ref 8.7–10.2)
GFR calc Af Amer: 131 mL/min/{1.73_m2} (ref >59)
GFR calc non Af Amer: 114 mL/min/{1.73_m2} (ref >59)
GLOBULIN, TOTAL: 2.5 g/dL (ref 1.5–4.5)
Glucose: 84 mg/dL (ref 65–99)
POTASSIUM: 4.2 mmol/L (ref 3.5–5.2)
SODIUM: 138 mmol/L (ref 134–144)
Total Protein: 7.2 g/dL (ref 6.0–8.5)

## 2018-04-08 LAB — LIPID PANEL
CHOL/HDL RATIO: 3.1 ratio (ref 0.0–4.4)
Cholesterol, Total: 156 mg/dL (ref 100–199)
HDL: 51 mg/dL (ref >39)
LDL CALC: 78 mg/dL (ref 0–99)
TRIGLYCERIDES: 136 mg/dL (ref 0–149)
VLDL Cholesterol Cal: 27 mg/dL (ref 5–40)

## 2018-04-08 LAB — RUBELLA SCREEN: Rubella Antibodies, IGG: 16.3 index (ref >0.99)

## 2018-04-08 LAB — CBC
HEMATOCRIT: 42.3 % (ref 34.0–46.6)
Hemoglobin: 13.9 g/dL (ref 11.1–15.9)
MCH: 30.9 pg (ref 26.6–33.0)
MCHC: 32.9 g/dL (ref 31.5–35.7)
MCV: 94 fL (ref 79–97)
Platelets: 293 10*3/uL (ref 150–450)
RBC: 4.5 x10E6/uL (ref 3.77–5.28)
RDW: 13.2 % (ref 12.3–15.4)
WBC: 7.8 10*3/uL (ref 3.4–10.8)

## 2018-04-11 ENCOUNTER — Telehealth: Payer: Self-pay

## 2018-04-11 NOTE — Telephone Encounter (Signed)
-----   Message from Patton SallesBrook E Amundson C Silva, MD sent at 04/10/2018 11:00 AM EDT ----- Please report all normal results to patient.  She has immunity to [place], and she does not need a booster vaccine.  Testing is normal for cholesterol, blood chemistries, and blood counts.

## 2018-04-11 NOTE — Telephone Encounter (Signed)
Informed patient of results.  Verbalized understanding.  Text sent for her to sign up for mychart.

## 2018-05-02 ENCOUNTER — Ambulatory Visit
Admission: RE | Admit: 2018-05-02 | Discharge: 2018-05-02 | Disposition: A | Discharge status: Home / Self Care | Payer: BLUE CROSS/BLUE SHIELD | Source: Ambulatory Visit | Attending: Obstetrics and Gynecology | Admitting: Obstetrics and Gynecology

## 2018-05-02 ENCOUNTER — Other Ambulatory Visit: Payer: Self-pay | Admitting: Obstetrics and Gynecology

## 2018-05-02 ENCOUNTER — Encounter (INDEPENDENT_AMBULATORY_CARE_PROVIDER_SITE_OTHER): Payer: Self-pay

## 2018-05-02 DIAGNOSIS — N6314 Unspecified lump in the right breast, lower inner quadrant: Secondary | ICD-10-CM | POA: Diagnosis not present

## 2018-05-02 DIAGNOSIS — N6489 Other specified disorders of breast: Secondary | ICD-10-CM

## 2018-05-02 DIAGNOSIS — N6310 Unspecified lump in the right breast, unspecified quadrant: Secondary | ICD-10-CM

## 2018-05-02 DIAGNOSIS — N6312 Unspecified lump in the right breast, upper inner quadrant: Secondary | ICD-10-CM | POA: Diagnosis not present

## 2018-05-02 DIAGNOSIS — R922 Inconclusive mammogram: Secondary | ICD-10-CM | POA: Diagnosis not present

## 2018-08-15 ENCOUNTER — Other Ambulatory Visit: Payer: Self-pay

## 2018-08-15 ENCOUNTER — Inpatient Hospital Stay (HOSPITAL_COMMUNITY)
Admission: AD | Admit: 2018-08-15 | Discharge: 2018-08-15 | Disposition: A | Discharge status: Home / Self Care | Payer: BLUE CROSS/BLUE SHIELD | Source: Ambulatory Visit | Attending: Obstetrics and Gynecology | Admitting: Obstetrics and Gynecology

## 2018-08-15 ENCOUNTER — Inpatient Hospital Stay (HOSPITAL_COMMUNITY): Payer: BLUE CROSS/BLUE SHIELD

## 2018-08-15 ENCOUNTER — Ambulatory Visit: Payer: BLUE CROSS/BLUE SHIELD | Admitting: Obstetrics and Gynecology

## 2018-08-15 ENCOUNTER — Encounter: Payer: Self-pay | Admitting: Obstetrics and Gynecology

## 2018-08-15 ENCOUNTER — Encounter (HOSPITAL_COMMUNITY): Payer: Self-pay | Admitting: *Deleted

## 2018-08-15 VITALS — BP 108/62 | HR 84 | Ht 66.0 in | Wt 165.6 lb

## 2018-08-15 DIAGNOSIS — R102 Pelvic and perineal pain: Secondary | ICD-10-CM

## 2018-08-15 DIAGNOSIS — O26891 Other specified pregnancy related conditions, first trimester: Secondary | ICD-10-CM | POA: Insufficient documentation

## 2018-08-15 DIAGNOSIS — Z3490 Encounter for supervision of normal pregnancy, unspecified, unspecified trimester: Secondary | ICD-10-CM

## 2018-08-15 DIAGNOSIS — N926 Irregular menstruation, unspecified: Secondary | ICD-10-CM

## 2018-08-15 DIAGNOSIS — R109 Unspecified abdominal pain: Secondary | ICD-10-CM | POA: Diagnosis not present

## 2018-08-15 DIAGNOSIS — Z3A01 Less than 8 weeks gestation of pregnancy: Secondary | ICD-10-CM

## 2018-08-15 DIAGNOSIS — Z3491 Encounter for supervision of normal pregnancy, unspecified, first trimester: Secondary | ICD-10-CM

## 2018-08-15 LAB — CBC
HEMATOCRIT: 41 % (ref 36.0–46.0)
HEMOGLOBIN: 14.1 g/dL (ref 12.0–15.0)
MCH: 31.1 pg (ref 26.0–34.0)
MCHC: 34.4 g/dL (ref 30.0–36.0)
MCV: 90.5 fL (ref 80.0–100.0)
NRBC: 0 % (ref 0.0–0.2)
Platelets: 257 10*3/uL (ref 150–400)
RBC: 4.53 MIL/uL (ref 3.87–5.11)
RDW: 12.6 % (ref 11.5–15.5)
WBC: 8.6 10*3/uL (ref 4.0–10.5)

## 2018-08-15 LAB — POCT URINE PREGNANCY: Preg Test, Ur: POSITIVE — AB

## 2018-08-15 LAB — URINALYSIS, ROUTINE W REFLEX MICROSCOPIC
BILIRUBIN URINE: NEGATIVE
Glucose, UA: NEGATIVE mg/dL
HGB URINE DIPSTICK: NEGATIVE
Ketones, ur: 5 mg/dL — AB
Nitrite: NEGATIVE
Protein, ur: NEGATIVE mg/dL
Specific Gravity, Urine: 1.028 (ref 1.005–1.030)
pH: 5 (ref 5.0–8.0)

## 2018-08-15 LAB — ABO/RH: ABO/RH(D): O POS

## 2018-08-15 LAB — HCG, QUANTITATIVE, PREGNANCY: HCG, BETA CHAIN, QUANT, S: 25724 m[IU]/mL — AB (ref <5)

## 2018-08-15 NOTE — Discharge Instructions (Signed)
Primeiro trimestre Scientist, research (medical) First Trimester of Pregnancy O primeiro trimestre da gestao vai da semana 1 at o final da semana 13 (ms 1 ao ms 3). Uma semana aps a fertilizao de um vulo por um espermatozoide, o vulo ser fixado na parede do tero. Esse embrio comear a se transformar em um beb. Seus genes e os genes de seu parceiro formaro o beb. Os genes masculinos determinam se o beb ser um menino ou uma menina. Nas semanas 6-8, os olhos e o rosto so formados e o batimento cardaco pode ser visto no ultrassom. No final da semana 12, todos os rgos do beb esto formados. Agora que voc est grvida, voc desejar fazer tudo que pode para ter um beb saudvel. Duas das coisas mais importantes so: Armed forces training and education officer bons cuidados pr-natais e seguir as instrues de seu mdico. Os cuidados pr-natais incluem todos os cuidados mdicos que voc recebe antes do nascimento do seu beb. Essa assistncia ajuda a prevenir, encontrar e tratar qualquer problema durante a gestao e o parto. Alteraes corporais durante o primeiro trimestre Seu corpo passa por muitas mudanas durante a gestao. As mudanas variam de mulher para [employer].  Voc poder ganhar ou perder alguns quilos no incio.  Voc pode sentir seu estmago embrulhado (enjoo) e pode vomitar (vmito). Se o vmito for incontrolvel, ligue para o seu mdico.  Voc poder se sentir cansada facilmente.  Voc poder sentir dores de cabea que podem ser [place] por medicamentos. Todos os medicamentos devero ser aprovados pelo seu mdico.  Voc poder urinar mais frequentemente. Sentir dor Engineer, maintenance (IT) pode indicar que voc tem uma infeco urinria.  Voc poder sentir Publishing copy Hazel Sams.  Voc poder ficar com o intestino preso porque certos hormnios fazem com que os msculos que empurram as fezes em seu intestino fiquem mais lentos.  Voc poder apresentar hemorroidas ou veias inchadas e salientes (veias  varicosas).  Seus seios podero comear a crescer e ficar sensveis. Seus mamilos podero ficar mais protuberantes, e o tecido ao redor deles (arola) mais escuro.  Suas gengivas podero sangrar e ficar sensveis  escovao e ao uso do fio dental.  Marcas escuras (cloasmas, mscaras de gravidez) podero surgir no seu rosto. Elas tendem a desaparecer depois do nascimento do beb.  Seus perodos menstruais cessaro.  Voc poder sentir falta de apetite.  Voc poder sentir vontade de comer certos tipos de comida.  Voc poder sentir alteraes em suas emoes diariamente, como estar animada por estar grvida ou se preocupar que algo d errado com a gestao e com o beb.  Voc poder ter sonhos mais intensos ou estranhos.  Voc poder notar alteraes nos seus cabelos. Essas alteraes incluem espessamento, crescimento rpido e alteraes na textura do seu cabelo. Algumas mulheres tambm sofrem queda de cabelos durante ou aps a gravidez, ou cabelos com aspecto seco e fino. Seus cabelos provavelmente voltaro ao normal depois do nascimento do beb. O que esperar das suas consultas pr-natais Durante uma consulta pr-natal de rotina:  Voc ser pesada para garantir que voc e o beb estejam se desenvolvendo normalmente.  Sua presso arterial ser aferida.  Seu abdome ser medido para acompanhar o crescimento de seu beb.  O batimento cardaco fetal ser [place] a semana 10 e a semana 14 de [place].  Os resultados de todas as consultas anteriores sero discutidos. Seu mdico poder lhe perguntar:  Como voc est se sentindo.  Se voc est sentindo o beb se mexer.  Se voc teve  algum sintoma anormal, como vazamento de lquido, sangramento, dores de cabea intensas ou clicas abdominais.  Se voc est usando derivados do tabaco, incluindo cigarros, tabaco de Theatre managermascar ou cigarros eletrnicos.  Se voc tem alguma dvida. Outros exames que podero ser realizados durante seu  primeiro trimestre incluem:  Exames de sangue para detectar seu tipo sanguneo e para verificar a presena de qualquer infeco anterior. Os exames tambm sero usados para verificar se os nveis de ferro esto baixos (anemia) e os nveis de protenas e hemcias (anticorpos de Rh). Dependendo de seus fatores de risco ou de voc j ter sofrido de diabetes durante a gravidez, Higher education careers adviservoc poder fazer exames para verificar se est com acar no sangue elevado, o que pode afetar gestantes (diabetes gestacional).  Exames de urina para verificar se h infeces, diabetes ou protena na urina.  Um ultrassom para confirmar o crescimento e desenvolvimento correto do beb.  Exames preventivos do feto para verificar problemas na medula vertebral (espinha bfida) e sndrome de Down.  Exame de HIV (vrus da imunodeficincia humana). Os exames pr-natais de rotina incluem testes de HIV, a menos que voc opte por no realizar esse teste.  Voc poder precisar de outros exames para garantir que voc e o beb esto indo bem. Siga essas instrues em casa: Medicamentos  Siga as instrues do seu mdico com relao ao uso de medicamentos. Medicamentos especficos podem ser seguros ou perigosos de serem tomados durante a Manufacturing systems engineergestao.  Tome vitaminas pr-natais que contenham pelo menos 600 microgramas (mcg) de cido flico.  Se sentir constipao, experimente tomar um emoliente fecal, caso seu mdico aprove isso. Alimentos e ZOXWRUEbebidas   Rodney Cruiseenha uma dieta equilibrada que inclua frutas e verduras frescas, gros integrais e boas fontes de protena, como carne, ovos ou tofu e produtos lcteos com baixo teor de [place]. Seu mdico ajudar a determinar a quantidade de ganho de peso correta para voc.  Evite carnes cruas e queijo no cozido. Eles carregam germes que podem causar defeitos de nascena no beb.  Fazer Comptrollerquatro ou cinco pequenas refeies em vez de trs refeies maiores pode ajudar a Paramedicaliviar o enjoo e o vmito. Se  voc comear a sentir enjoo, comer algumas bolachas pode ajudar. A ingesto de lquidos entre as Musicianrefeies em vez de durante as refeies, tambm parece ajudar a evitar enjoo e vmito.  Limite os alimentos ricos em gordura e acares processados, tais como frituras e doces.  Para prevenir a constipao: ? Coma alimentos ricos em [place], como frutas e verduras frescas, gros integrais e feijo. ? Beba lquidos em quantidade suficiente para manter sua urina clara ou na cor amarela plida. Atividades  Faa exerccios apenas conforme orientado pelo seu mdico. A maioria das mulheres pode continuar seus exerccios usuais durante a Environmental education officergravidez. Tente fazer exerccios por 30 minutos pelo menos 5 dias por semana. Os exerccios ajudam a: ? Controlar o peso. ? Ficar em forma. ? Estar preparada para o trabalho de parto e o parto.  Sentir dores ou cibras na parte inferior do abdome ou das costas  um bom sinal para voc parar de Chief Strategy Officerse exercitar. Consulte seu mdico antes de continuar os exerccios regulares.  Tente evitar ficar em p por longos perodos de tempo. Mova suas pernas com frequncia se precisar ficar em p em um s local por muito tempo.  Evite levantar peso.  Use sapatos de salto baixo e mantenha uma boa postura corporal.  Voc pode continuar a ter relaes sexuais, a menos que seu mdico a oriente de [place]  diferente. Alvio da dor e do desconforto  Use um bom suti de sustentao para aliviar a sensibilidade nos seios.  Tome banhos mornos de assento para Child psychotherapist dor ou desconforto causado pelas hemorroidas. Use um creme para hemorroidas caso seu mdico aprove.  Repouse com as pernas elevadas se tiver cibras nas pernas ou dor lombar.  Se surgirem veias varicosas em suas pernas, use meias elsticas. Eleve os ps por 15 minutos 3-4 vezes por dia. Modere a quantidade de sal em sua dieta. Cuidados pr-natais  Agende suas consultas pr-natais at a dcima segunda semana de  [place]. Geralmente, elas so agendadas mensalmente no incio e depois ocorrem com maior frequncia nos ltimos 2 meses antes do parto.  Anote suas perguntas. Leve-as s consultas pr-natais.  Continue a comparecer a todas as suas consultas como orientado pelo seu mdico. Isso  importante. Segurana  Sempre use o cinto de Music therapist.  Faa uma lista de nmeros de telefone de Museum/gallery exhibitions officer, incluindo nmeros de parentes, amigos, do hospital, da [employer]. Instrues gerais  Pea uma indicao de aulas locais de educao pr-natal ao seu mdico. Comece as aulas antes do incio do ms 6 de [employer].  Pea ajuda caso precise de aconselhamento ou de informaes nutricionais durante a gestao. Seu mdico poder oferecer orientao ou encaminh-la a um especialista para que voc obtenha ajuda sobre necessidades diversas.  No use banheiras de agu quente, saunas secas [address].  No tome duchas e no use absorventes internos ou absorventes perfumados.  No cruze suas pernas por longos perodos de tempo.  Mantenha distncia de caixas de areia de gatos e de terrenos onde houver gatos. Eles carregam germes que podem causar defeitos de nascena no beb e possivelmente a perda do feto por aborto espontneo ou parto de natimorto.  Evite fumo de qualquer tipo, fitoterpicos, lcool e medicamentos no prescritos pelo seu mdico. As substncias qumicas desses produtos afetam a formao e o crescimento do beb.  No use nenhum produto que contenha nicotina ou tabaco, como cigarros e cigarros eletrnicos. Caso precise de ajuda para parar de fumar, fale com seu mdico. Voc poder receber Jerene Bears e outros recursos para lhe ajudar a parar.  Agende uma consulta com um dentista. Em casa, use uma escova de dentes macia para escovar os dentes e use o fio dental com cuidado. Entre em contato com um mdico se:  Tiver vertigem.  Tiver cibra plvica suave, presso plvica ou  dor persistente na regio abdominal.  Ocorrerem enjoo, vmito ou diarreia persistentes.  Ocorrer corrimento vaginal com cheiro ruim.  Sentir dor Engineer, maintenance (IT).  Notar um inchao em seu rosto, mos, pernas ou tornozelos.  For exposta a eritema infeccioso ou catapora.  For exposta a sarampo (rubola) e nunca tiver contrado a doena. Tomasa Hose ajuda imediatamente se:  Tiver febre.  Notar secreo em sua vagina.  Perceber sangramento de escape ou hemorragia vaginal.  Sentir dor ou cibra intensa no abdome.  Sofrer perda ou ganho sbito de [place].  Vomitar sangue ou material parecido com borra de caf.  Sentir dor de cabea intensa.  Tiver falta de ar.  Sofrer qualquer tipo de trauma, como uma queda ou um acidente de carro. Resumo  O primeiro trimestre da gestao vai da semana 1 at o final da semana 13 (ms 1 ao ms 3).  Seu corpo passa por muitas mudanas durante a gestao. As mudanas variam de mulher para [employer].  Voc far consultas pr-natais de rotina. Durante essas consultas, seu mdico  examinar voc, discutir Land O'Lakesquaisquer resultados de exames que voc possa ter e conversar sobre como voc est se sentindo. Estas informaes no se destinam a substituir as recomendaes de seu mdico. No deixe de discutir quaisquer dvidas com seu mdico. Document Released: 10/28/2015 Document Revised: 11/05/2016 Document Reviewed: 11/05/2016 Elsevier Interactive Patient Education  2019 Elsevier Inc.    Dor abdominal durante a gravidez Abdominal Pain During Pregnancy  A dor abdominal  comum durante a gravidez e tem muitas causas possveis. Algumas causas so mais srias que outras e, algumas vezes, a causa no  conhecida. A dor abdominal pode ser um sinal de incio do trabalho de parto. Tambm pode ser causada por crescimento e alongamento anormal dos msculos e ligamentos durante a gravidez. Sempre informe o mdico, caso sinta qualquer dor abdominal. Siga essas instrues em  casa:  No tenha relaes sexuais nem coloque nada na vagina at os sintomas terem desaparecido completamente.  Repouse bastante at a Marine scientistdor melhorar.  Beba lquidos em quantidade suficiente para manter a urina na cor amarela plida.  Tome medicamentos vendidos com ou sem receita mdica somente de acordo com as indicaes do seu mdico.  Comparea a todas as consultas de acompanhamento de acordo com as orientaes do seu mdico. Isso  importante. Entre em contato com um mdico se:  A dor continuar ou piorar depois de Lawyerdescansar.  Sentir dor na parte abdominal inferior que: ? Vem e vai em intervalos regulares. ? Reflete nas costas. ?  semelhante a clicas menstruais.  Sentir sensao de Qatarqueimao ou dor ao Dentisturinar. Tomasa Hosebtenha ajuda imediatamente se:  Tiver febre ou calafrios.  Ocorrer sangramento vaginal.  Notar secreo em sua vagina.  Estiver expelindo tecido Public relations account executiveda vagina.  Vomitar ou tiver diarreia por mais de 24 horas.  O beb estiver se mexendo menos que o normal.  Sentir-se muito fraca ou desmaiar.  Tiver falta de ar.  Desenvolver dor intensa no abdome superior. Resumo  A dor abdominal  comum durante a gravidez e tem muitas causas possveis.  Se tiver dor abdominal durante a gestao, informe imediatamente ao mdico.  Siga as instrues do mdico quanto aos cuidado em casa e mantenha todas as consultas de acompanhamento conforme orientado. Estas informaes no se destinam a substituir as recomendaes de seu mdico. No deixe de discutir quaisquer dvidas com seu mdico. Document Released: 03/27/2015 Document Revised: 12/01/2016 Document Reviewed: 12/01/2016 Elsevier Interactive Patient Education  2019 [employer]

## 2018-08-15 NOTE — MAU Provider Note (Signed)
Chief Complaint: Abdominal Pain   First Provider Initiated Contact with Patient 08/15/18 1640     SUBJECTIVE HPI: Diane Matthews is a 38 y.o. G1P0000 at 3925w2d who presents to Maternity Admissions reporting abdominal pain. Had positive UPT today. [facility] by Dr. Edward JollySilva. Had exam and was sent here for imaging d/t pain.  Lower abdominal cramping that radiates to low back. Pain started a week ago. Denies n/v/d, vaginal bleeding, dysuria, or vaginal discharge.   Location: lower abdomen & low back Quality: cramping Severity: 7/10 on pain scale Duration: 1 week Timing: intermittent Modifying factors: none Associated signs and symptoms: none  No past medical history on file. OB History  Gravida Para Term Preterm AB Living  1 0 0 0 0 0  SAB TAB Ectopic Multiple Live Births  0 0 0 0 0    # Outcome Date GA Lbr Len/2nd Weight Sex Delivery Anes PTL Lv  1 Current            Past Surgical History:  Procedure Laterality Date  . AUGMENTATION MAMMAPLASTY     Silicone implants--[place]   Social History   Socioeconomic History  . Marital status: Married    Spouse name: Not on file  . Number of children: Not on file  . Years of education: Not on file  . Highest education level: Not on file  Occupational History  . Not on file  Social Needs  . Financial resource strain: Not on file  . Food insecurity:    Worry: Not on file    Inability: Not on file  . Transportation needs:    Medical: Not on file    Non-medical: Not on file  Tobacco Use  . Smoking status: Never Smoker  . Smokeless tobacco: Never Used  Substance and Sexual Activity  . Alcohol use: Yes    Alcohol/week: 2.0 standard drinks    Types: 2 Glasses of wine per week  . Drug use: No  . Sexual activity: Yes    Birth control/protection: I.U.D., Pill    Comment: Mirena inseted 04/2015  Lifestyle  . Physical activity:    Days per week: Not on file    Minutes per session: Not on  file  . Stress: Not on file  Relationships  . Social connections:    Talks on phone: Not on file    Gets together: Not on file    Attends religious service: Not on file    Active member of club or organization: Not on file    Attends meetings of clubs or organizations: Not on file    Relationship status: Not on file  . Intimate partner violence:    Fear of current or ex partner: Not on file    Emotionally abused: Not on file    Physically abused: Not on file    Forced sexual activity: Not on file  Other Topics Concern  . Not on file  Social History Narrative  . Not on file   Family History  Problem Relation Age of Onset  . Diabetes Paternal Grandmother    No current facility-administered medications on file prior to encounter.    No current outpatient medications on file prior to encounter.   No Known Allergies  I have reviewed patient's Past Medical Hx, Surgical Hx, Family Hx, Social Hx, medications and allergies.   Review of Systems  Constitutional: Negative.   Gastrointestinal: Positive for abdominal pain. Negative for diarrhea, nausea and vomiting.  Genitourinary: Negative.     OBJECTIVE Patient Vitals for the past 24 hrs:  BP Temp Temp src Pulse Resp SpO2 Height Weight  08/15/18 1633 106/66 98.3 F (36.8 C) Oral 78 15 98 % 5\' 5"  (1.651 m) 75.8 kg   Constitutional: Well-developed, well-nourished female in no acute distress.  Cardiovascular: normal rate & rhythm, no murmur Respiratory: normal rate and effort. Lung sounds clear throughout GI: Abd soft, non-tender, Pos BS x 4. No guarding or rebound tenderness MS: Extremities nontender, no edema, normal ROM Neurologic: Alert and oriented x 4.  GU:   Deferred, completed in office today by Dr. Edward JollySilva   LAB RESULTS Results for orders placed or performed during the hospital encounter of 08/15/18 (from the past 24 hour(s))  Urinalysis, Routine w reflex microscopic     Status: Abnormal   Collection Time: 08/15/18   4:35 PM  Result Value Ref Range   Color, Urine AMBER (A) YELLOW   APPearance HAZY (A) CLEAR   Specific Gravity, Urine 1.028 1.005 - 1.030   pH 5.0 5.0 - 8.0   Glucose, UA NEGATIVE NEGATIVE mg/dL   Hgb urine dipstick NEGATIVE NEGATIVE   Bilirubin Urine NEGATIVE NEGATIVE   Ketones, ur 5 (A) NEGATIVE mg/dL   Protein, ur NEGATIVE NEGATIVE mg/dL   Nitrite NEGATIVE NEGATIVE   Leukocytes, UA TRACE (A) NEGATIVE   RBC / HPF 0-5 0 - 5 RBC/hpf   WBC, UA 0-5 0 - 5 WBC/hpf   Bacteria, UA RARE (A) NONE SEEN   Squamous Epithelial / LPF 0-5 0 - 5   Mucus PRESENT   CBC     Status: None   Collection Time: 08/15/18  4:52 PM  Result Value Ref Range   WBC 8.6 4.0 - 10.5 K/uL   RBC 4.53 3.87 - 5.11 MIL/uL   Hemoglobin 14.1 12.0 - 15.0 g/dL   HCT 95.641.0 21.336.0 - 08.646.0 %   MCV 90.5 80.0 - 100.0 fL   MCH 31.1 26.0 - 34.0 pg   MCHC 34.4 30.0 - 36.0 g/dL   RDW 57.812.6 46.911.5 - 62.915.5 %   Platelets 257 150 - 400 K/uL   nRBC 0.0 0.0 - 0.2 %  ABO/Rh     Status: None (Preliminary result)   Collection Time: 08/15/18  4:52 PM  Result Value Ref Range   ABO/RH(D)      O POS Performed at [facility], [address] [place], [place] [id number]   hCG, quantitative, pregnancy     Status: Abnormal   Collection Time: 08/15/18  4:52 PM  Result Value Ref Range   hCG, Beta Chain, Quant, S 25,724 (H) <5 mIU/mL    IMAGING [place] Ob Less Than 14 Weeks With Ob Transvaginal  Result Date: 08/15/2018 CLINICAL DATA:  Abdominal pain during first trimester of pregnancy, lower abdominal pain for 1 week, EGA [redacted] weeks 3 days by LMP of 07/08/2018 EXAM: OBSTETRIC <14 WK US AND TRANSVAGINAL OB US TECHNIQUE: Both transabdominal and transvaginal ultrasound examinations were performed for complete evaluation of the gestation as well as the maternal uterus, adnexal regions, and pelvic cul-de-sac. Transvaginal technique was performed to assess early pregnancy. COMPARISON:  None for this gestation FINDINGS: Intrauterine gestational sac: Present,  single Yolk sac:  Present Embryo:  Not identified Cardiac Activity: N/A Heart Rate: N/A  bpm MSD: 14 mm   6 w   2 d Subchorionic hemorrhage:  None visualized. Maternal uterus/adnexae: RIGHT ovary measures 3.1 x 2.1 x 2.8 cm and contains a corpus luteum. LEFT  ovary normal size and morphology 3.1 x 1.8 x 1.4 cm. No free pelvic fluid or adnexal masses. IMPRESSION: Gestational sac within the uterus containing a yolk sac but no fetal pole identified to establish viability; if clinically indicated, follow-up ultrasound could be performed in 14 days to establish viability. Remainder of exam unremarkable. Electronically Signed   By: Ulyses Southward M.D.   On: 08/15/2018 17:22    MAU COURSE Orders Placed This Encounter  Procedures  . Culture, OB Urine  . US OB LESS THAN 14 WEEKS WITH OB TRANSVAGINAL  . Urinalysis, Routine w reflex microscopic  . CBC  . hCG, quantitative, pregnancy  . ABO/Rh  . Discharge patient   No orders of the defined types were placed in this encounter.   MDM RH positive Ultrasound shows IUGS with yolk sac  ASSESSMENT 1. Normal IUP (intrauterine pregnancy) on prenatal ultrasound, first trimester   2. Abdominal pain during pregnancy in first trimester     PLAN Discharge home in stable condition. Given list of ob/gyns Start prenatal care Discussed reasons to return to MAU  Allergies as of 08/15/2018   No Known Allergies     Medication List    You have not been prescribed any medications.      Judeth Horn, NP 08/15/2018  6:01 PM

## 2018-08-15 NOTE — Progress Notes (Signed)
GYNECOLOGY  VISIT   HPI: 38 y.o.   Married  Caucasian/[place]ian  female   [id number] with Patient's last menstrual period was 07/08/2018 (exact date).   here for pregnancy confirmation. Patient is having some abdominal cramping, but denies any bleeding.  Husband present for the entire visit today.   Having lower abdominal midline pain.  Comes and goes.  Low back pain also.   Last BM was today.  Having constipation.   Little nausea.  No breast tenderness or urinary frequency.   Taking PNV.  UPT: Positive here.  Rubella immune.   Did flu vaccine.   GYNECOLOGIC HISTORY: Patient's last menstrual period was 07/08/2018 (exact date). Contraception: None Menopausal hormone therapy:  n/a Last mammogram:  09/30/2017 BI-RADS CATEGORY 3: Probably benign. Last pap smear:  07/2017 normal per patient        OB History    Gravida  0   Para  0   Term  0   Preterm  0   AB  0   Living  0     SAB  0   TAB  0   Ectopic  0   Multiple  0   Live Births  0              Patient Active Problem List   Diagnosis Date Noted  . Bilateral ovarian cysts 12/12/2017    History reviewed. No pertinent past medical history.  Past Surgical History:  Procedure Laterality Date  . AUGMENTATION MAMMAPLASTY     Silicone implants--[place]    No current outpatient medications on file.   No current facility-administered medications for this visit.      ALLERGIES: Patient has no known allergies.  Family History  Problem Relation Age of Onset  . Diabetes Paternal Grandmother     Social History   Socioeconomic History  . Marital status: Married    Spouse name: Not on file  . Number of children: Not on file  . Years of education: Not on file  . Highest education level: Not on file  Occupational History  . Not on file  Social Needs  . Financial resource strain: Not on file  . Food insecurity:    Worry: Not on file    Inability: Not on file  . Transportation needs:     Medical: Not on file    Non-medical: Not on file  Tobacco Use  . Smoking status: Never Smoker  . Smokeless tobacco: Never Used  Substance and Sexual Activity  . Alcohol use: Yes    Alcohol/week: 2.0 standard drinks    Types: 2 Glasses of wine per week  . Drug use: No  . Sexual activity: Yes    Birth control/protection: I.U.D., Pill    Comment: Mirena inseted 04/2015  Lifestyle  . Physical activity:    Days per week: Not on file    Minutes per session: Not on file  . Stress: Not on file  Relationships  . Social connections:    Talks on phone: Not on file    Gets together: Not on file    Attends religious service: Not on file    Active member of club or organization: Not on file    Attends meetings of clubs or organizations: Not on file    Relationship status: Not on file  . Intimate partner violence:    Fear of current or ex partner: Not on file    Emotionally abused: Not on file    Physically abused: Not  on file    Forced sexual activity: Not on file  Other Topics Concern  . Not on file  Social History Narrative  . Not on file    Review of Systems  All other systems reviewed and are negative.   PHYSICAL EXAMINATION:    BP 108/62 (BP Location: Right Arm, Patient Position: Sitting, Cuff Size: Normal)   Pulse 84   Ht 5\' 6"  (1.676 m)   Wt 165 lb 9.6 oz (75.1 kg)   LMP 07/08/2018 (Exact Date)   BMI 26.73 kg/m     General appearance: alert, cooperative and appears stated age   Pelvic: External genitalia:  no lesions              Urethra:  normal appearing urethra with no masses, tenderness or lesions              Bartholins and Skenes: normal                 Vagina: normal appearing vagina with normal color and discharge, no lesions              Cervix: no lesions                Bimanual Exam:  Uterus:  normal size, contour, position, consistency, mobility, non-tender              Adnexa: no mass, fullness, tenderness          Chaperone was present for  exam.  ASSESSMENT  5 + [redacted] weeks pregnant.  Pelvic pain.  Constipation.  AMA status.  PLAN  We discussed early pregnancy and avoidance of exposures to uncooked or unprepared foods, tobacco, ETOH.  Try Colace stool softener.  [facility] and pelvic US.  I discussed the importance of determining if the pregnancy is intrauterine or ectopic and to confirm gestational age and viability.  If IUP but no cardiac activity yet, return for pelvic US here next week.  List of OB providers.    An After Visit Summary was printed and given to the patient.  __25____ minutes face to face time of which over 50% was spent in counseling.

## 2018-08-15 NOTE — MAU Note (Signed)
Pt reports positive preg test today, lower abd pain x one week. Denies bleeding.

## 2018-08-16 LAB — CULTURE, OB URINE: CULTURE: NO GROWTH

## 2018-08-26 DIAGNOSIS — Z3201 Encounter for pregnancy test, result positive: Secondary | ICD-10-CM | POA: Diagnosis not present

## 2018-08-31 DIAGNOSIS — M9904 Segmental and somatic dysfunction of sacral region: Secondary | ICD-10-CM | POA: Diagnosis not present

## 2018-08-31 DIAGNOSIS — M545 Low back pain: Secondary | ICD-10-CM | POA: Diagnosis not present

## 2018-08-31 DIAGNOSIS — M62830 Muscle spasm of back: Secondary | ICD-10-CM | POA: Diagnosis not present

## 2018-08-31 DIAGNOSIS — M9903 Segmental and somatic dysfunction of lumbar region: Secondary | ICD-10-CM | POA: Diagnosis not present

## 2018-09-06 DIAGNOSIS — M62830 Muscle spasm of back: Secondary | ICD-10-CM | POA: Diagnosis not present

## 2018-09-06 DIAGNOSIS — M9903 Segmental and somatic dysfunction of lumbar region: Secondary | ICD-10-CM | POA: Diagnosis not present

## 2018-09-06 DIAGNOSIS — M545 Low back pain: Secondary | ICD-10-CM | POA: Diagnosis not present

## 2018-09-06 DIAGNOSIS — M9904 Segmental and somatic dysfunction of sacral region: Secondary | ICD-10-CM | POA: Diagnosis not present

## 2018-09-23 DIAGNOSIS — Z3401 Encounter for supervision of normal first pregnancy, first trimester: Secondary | ICD-10-CM | POA: Diagnosis not present

## 2018-09-23 DIAGNOSIS — Z3689 Encounter for other specified antenatal screening: Secondary | ICD-10-CM | POA: Diagnosis not present

## 2018-09-23 DIAGNOSIS — Z118 Encounter for screening for other infectious and parasitic diseases: Secondary | ICD-10-CM | POA: Diagnosis not present

## 2018-09-23 DIAGNOSIS — O09511 Supervision of elderly primigravida, first trimester: Secondary | ICD-10-CM | POA: Diagnosis not present

## 2018-09-23 LAB — OB RESULTS CONSOLE ABO/RH: RH Type: POSITIVE

## 2018-09-23 LAB — OB RESULTS CONSOLE GC/CHLAMYDIA
Chlamydia: NEGATIVE
Gonorrhea: NEGATIVE

## 2018-09-23 LAB — OB RESULTS CONSOLE RPR: RPR: NONREACTIVE

## 2018-09-23 LAB — OB RESULTS CONSOLE RUBELLA ANTIBODY, IGM: Rubella: IMMUNE

## 2018-09-23 LAB — OB RESULTS CONSOLE HEPATITIS B SURFACE ANTIGEN: Hepatitis B Surface Ag: NEGATIVE

## 2018-09-23 LAB — OB RESULTS CONSOLE ANTIBODY SCREEN: Antibody Screen: NEGATIVE

## 2018-09-23 LAB — OB RESULTS CONSOLE HIV ANTIBODY (ROUTINE TESTING): HIV: NONREACTIVE

## 2018-10-25 DIAGNOSIS — Z3A15 15 weeks gestation of pregnancy: Secondary | ICD-10-CM | POA: Diagnosis not present

## 2018-10-25 DIAGNOSIS — Z361 Encounter for antenatal screening for raised alphafetoprotein level: Secondary | ICD-10-CM | POA: Diagnosis not present

## 2018-10-25 DIAGNOSIS — O09512 Supervision of elderly primigravida, second trimester: Secondary | ICD-10-CM | POA: Diagnosis not present

## 2018-10-31 ENCOUNTER — Encounter (HOSPITAL_COMMUNITY): Payer: Self-pay

## 2018-11-02 ENCOUNTER — Other Ambulatory Visit: Payer: BLUE CROSS/BLUE SHIELD

## 2018-11-07 ENCOUNTER — Other Ambulatory Visit: Payer: BLUE CROSS/BLUE SHIELD

## 2018-11-23 DIAGNOSIS — Z3A19 19 weeks gestation of pregnancy: Secondary | ICD-10-CM | POA: Diagnosis not present

## 2018-11-23 DIAGNOSIS — O09512 Supervision of elderly primigravida, second trimester: Secondary | ICD-10-CM | POA: Diagnosis not present

## 2018-11-23 DIAGNOSIS — O09519 Supervision of elderly primigravida, unspecified trimester: Secondary | ICD-10-CM | POA: Diagnosis not present

## 2018-11-28 DIAGNOSIS — H20012 Primary iridocyclitis, left eye: Secondary | ICD-10-CM | POA: Diagnosis not present

## 2018-12-02 DIAGNOSIS — H1032 Unspecified acute conjunctivitis, left eye: Secondary | ICD-10-CM | POA: Diagnosis not present

## 2018-12-02 DIAGNOSIS — H20012 Primary iridocyclitis, left eye: Secondary | ICD-10-CM | POA: Diagnosis not present

## 2018-12-02 DIAGNOSIS — H15002 Unspecified scleritis, left eye: Secondary | ICD-10-CM | POA: Diagnosis not present

## 2018-12-06 DIAGNOSIS — H15002 Unspecified scleritis, left eye: Secondary | ICD-10-CM | POA: Diagnosis not present

## 2018-12-16 DIAGNOSIS — H15002 Unspecified scleritis, left eye: Secondary | ICD-10-CM | POA: Diagnosis not present

## 2018-12-21 DIAGNOSIS — Z3A23 23 weeks gestation of pregnancy: Secondary | ICD-10-CM | POA: Diagnosis not present

## 2018-12-21 DIAGNOSIS — O09512 Supervision of elderly primigravida, second trimester: Secondary | ICD-10-CM | POA: Diagnosis not present

## 2019-01-16 DIAGNOSIS — Z3A27 27 weeks gestation of pregnancy: Secondary | ICD-10-CM | POA: Diagnosis not present

## 2019-01-16 DIAGNOSIS — Z3689 Encounter for other specified antenatal screening: Secondary | ICD-10-CM | POA: Diagnosis not present

## 2019-01-16 DIAGNOSIS — O09512 Supervision of elderly primigravida, second trimester: Secondary | ICD-10-CM | POA: Diagnosis not present

## 2019-01-16 DIAGNOSIS — Z23 Encounter for immunization: Secondary | ICD-10-CM | POA: Diagnosis not present

## 2019-01-30 DIAGNOSIS — Z3A29 29 weeks gestation of pregnancy: Secondary | ICD-10-CM | POA: Diagnosis not present

## 2019-01-30 DIAGNOSIS — O3663X0 Maternal care for excessive fetal growth, third trimester, not applicable or unspecified: Secondary | ICD-10-CM | POA: Diagnosis not present

## 2019-01-30 DIAGNOSIS — O09513 Supervision of elderly primigravida, third trimester: Secondary | ICD-10-CM | POA: Diagnosis not present

## 2019-02-13 DIAGNOSIS — Z3A31 31 weeks gestation of pregnancy: Secondary | ICD-10-CM | POA: Diagnosis not present

## 2019-02-13 DIAGNOSIS — O09513 Supervision of elderly primigravida, third trimester: Secondary | ICD-10-CM | POA: Diagnosis not present

## 2019-02-13 DIAGNOSIS — O3663X0 Maternal care for excessive fetal growth, third trimester, not applicable or unspecified: Secondary | ICD-10-CM | POA: Diagnosis not present

## 2019-03-02 DIAGNOSIS — O09513 Supervision of elderly primigravida, third trimester: Secondary | ICD-10-CM | POA: Diagnosis not present

## 2019-03-02 DIAGNOSIS — Z3A33 33 weeks gestation of pregnancy: Secondary | ICD-10-CM | POA: Diagnosis not present

## 2019-03-02 DIAGNOSIS — O3663X0 Maternal care for excessive fetal growth, third trimester, not applicable or unspecified: Secondary | ICD-10-CM | POA: Diagnosis not present

## 2019-03-16 DIAGNOSIS — O09513 Supervision of elderly primigravida, third trimester: Secondary | ICD-10-CM | POA: Diagnosis not present

## 2019-03-16 DIAGNOSIS — Z3A35 35 weeks gestation of pregnancy: Secondary | ICD-10-CM | POA: Diagnosis not present

## 2019-03-16 DIAGNOSIS — Z3685 Encounter for antenatal screening for Streptococcus B: Secondary | ICD-10-CM | POA: Diagnosis not present

## 2019-03-16 DIAGNOSIS — O3663X0 Maternal care for excessive fetal growth, third trimester, not applicable or unspecified: Secondary | ICD-10-CM | POA: Diagnosis not present

## 2019-03-16 LAB — OB RESULTS CONSOLE GBS: GBS: NEGATIVE

## 2019-03-23 DIAGNOSIS — Z3A36 36 weeks gestation of pregnancy: Secondary | ICD-10-CM | POA: Diagnosis not present

## 2019-03-23 DIAGNOSIS — O09513 Supervision of elderly primigravida, third trimester: Secondary | ICD-10-CM | POA: Diagnosis not present

## 2019-03-28 ENCOUNTER — Telehealth (HOSPITAL_COMMUNITY): Payer: Self-pay | Admitting: *Deleted

## 2019-03-28 ENCOUNTER — Encounter (HOSPITAL_COMMUNITY): Payer: Self-pay | Admitting: *Deleted

## 2019-03-28 NOTE — Telephone Encounter (Signed)
Preadmission screen  

## 2019-03-30 DIAGNOSIS — O09513 Supervision of elderly primigravida, third trimester: Secondary | ICD-10-CM | POA: Diagnosis not present

## 2019-03-30 DIAGNOSIS — Z3A37 37 weeks gestation of pregnancy: Secondary | ICD-10-CM | POA: Diagnosis not present

## 2019-03-31 ENCOUNTER — Encounter (HOSPITAL_COMMUNITY): Payer: Self-pay | Admitting: *Deleted

## 2019-03-31 ENCOUNTER — Telehealth (HOSPITAL_COMMUNITY): Payer: Self-pay | Admitting: *Deleted

## 2019-03-31 NOTE — Telephone Encounter (Signed)
Preadmission screen  

## 2019-04-05 ENCOUNTER — Other Ambulatory Visit: Payer: Self-pay | Admitting: Advanced Practice Midwife

## 2019-04-06 ENCOUNTER — Other Ambulatory Visit: Payer: Self-pay | Admitting: Obstetrics & Gynecology

## 2019-04-07 DIAGNOSIS — Z23 Encounter for immunization: Secondary | ICD-10-CM | POA: Diagnosis not present

## 2019-04-07 DIAGNOSIS — O09513 Supervision of elderly primigravida, third trimester: Secondary | ICD-10-CM | POA: Diagnosis not present

## 2019-04-07 DIAGNOSIS — Z3A39 39 weeks gestation of pregnancy: Secondary | ICD-10-CM | POA: Diagnosis not present

## 2019-04-09 ENCOUNTER — Other Ambulatory Visit: Payer: Self-pay

## 2019-04-09 ENCOUNTER — Other Ambulatory Visit (HOSPITAL_COMMUNITY)
Admission: RE | Admit: 2019-04-09 | Discharge: 2019-04-09 | Disposition: A | Discharge status: Home / Self Care | Payer: BC Managed Care – PPO | Source: Ambulatory Visit | Attending: Obstetrics and Gynecology | Admitting: Obstetrics and Gynecology

## 2019-04-09 DIAGNOSIS — Z01812 Encounter for preprocedural laboratory examination: Secondary | ICD-10-CM | POA: Insufficient documentation

## 2019-04-09 DIAGNOSIS — Z20828 Contact with and (suspected) exposure to other viral communicable diseases: Secondary | ICD-10-CM | POA: Diagnosis not present

## 2019-04-09 LAB — SARS CORONAVIRUS 2 (TAT 6-24 HRS): SARS Coronavirus 2: NEGATIVE

## 2019-04-09 NOTE — MAU Note (Signed)
Pt here for PAT covid swab, denies symptoms. Swab collected. 

## 2019-04-11 ENCOUNTER — Encounter (HOSPITAL_COMMUNITY)
Admission: AD | Disposition: A | Discharge status: Home / Self Care | Payer: Self-pay | Source: Home / Self Care | Attending: Obstetrics & Gynecology

## 2019-04-11 ENCOUNTER — Inpatient Hospital Stay (HOSPITAL_COMMUNITY)
Admission: AD | Admit: 2019-04-11 | Discharge: 2019-04-13 | DRG: 787 | Disposition: A | Discharge status: Home / Self Care | Payer: BC Managed Care – PPO | Attending: Obstetrics & Gynecology | Admitting: Obstetrics & Gynecology

## 2019-04-11 ENCOUNTER — Inpatient Hospital Stay (HOSPITAL_COMMUNITY): Payer: BC Managed Care – PPO | Admitting: Anesthesiology

## 2019-04-11 ENCOUNTER — Inpatient Hospital Stay (HOSPITAL_COMMUNITY): Payer: BC Managed Care – PPO

## 2019-04-11 ENCOUNTER — Other Ambulatory Visit: Payer: Self-pay

## 2019-04-11 ENCOUNTER — Encounter (HOSPITAL_COMMUNITY): Payer: Self-pay

## 2019-04-11 DIAGNOSIS — Z3A39 39 weeks gestation of pregnancy: Secondary | ICD-10-CM

## 2019-04-11 DIAGNOSIS — D696 Thrombocytopenia, unspecified: Secondary | ICD-10-CM | POA: Diagnosis not present

## 2019-04-11 DIAGNOSIS — O99892 Other specified diseases and conditions complicating childbirth: Secondary | ICD-10-CM | POA: Diagnosis present

## 2019-04-11 DIAGNOSIS — O26893 Other specified pregnancy related conditions, third trimester: Secondary | ICD-10-CM | POA: Diagnosis not present

## 2019-04-11 DIAGNOSIS — O4590 Premature separation of placenta, unspecified, unspecified trimester: Secondary | ICD-10-CM | POA: Diagnosis not present

## 2019-04-11 DIAGNOSIS — D62 Acute posthemorrhagic anemia: Secondary | ICD-10-CM | POA: Diagnosis not present

## 2019-04-11 DIAGNOSIS — O9081 Anemia of the puerperium: Secondary | ICD-10-CM | POA: Diagnosis not present

## 2019-04-11 DIAGNOSIS — Z9882 Breast implant status: Secondary | ICD-10-CM

## 2019-04-11 DIAGNOSIS — O4593 Premature separation of placenta, unspecified, third trimester: Principal | ICD-10-CM | POA: Diagnosis present

## 2019-04-11 DIAGNOSIS — O9912 Other diseases of the blood and blood-forming organs and certain disorders involving the immune mechanism complicating childbirth: Secondary | ICD-10-CM | POA: Diagnosis not present

## 2019-04-11 DIAGNOSIS — Z3490 Encounter for supervision of normal pregnancy, unspecified, unspecified trimester: Secondary | ICD-10-CM | POA: Diagnosis present

## 2019-04-11 DIAGNOSIS — Z3A00 Weeks of gestation of pregnancy not specified: Secondary | ICD-10-CM | POA: Diagnosis not present

## 2019-04-11 HISTORY — DX: Other constipation: K59.09

## 2019-04-11 HISTORY — DX: Supervision of elderly primigravida, unspecified trimester: O09.519

## 2019-04-11 HISTORY — DX: Unspecified ovarian cyst, right side: N83.201

## 2019-04-11 LAB — CBC
HCT: 38 % (ref 36.0–46.0)
Hemoglobin: 13.4 g/dL (ref 12.0–15.0)
MCH: 33.8 pg (ref 26.0–34.0)
MCHC: 35.3 g/dL (ref 30.0–36.0)
MCV: 95.7 fL (ref 80.0–100.0)
Platelets: 158 10*3/uL (ref 150–400)
RBC: 3.97 MIL/uL (ref 3.87–5.11)
RDW: 12.9 % (ref 11.5–15.5)
WBC: 9 10*3/uL (ref 4.0–10.5)
nRBC: 0 % (ref 0.0–0.2)

## 2019-04-11 LAB — TYPE AND SCREEN
ABO/RH(D): O POS
Antibody Screen: NEGATIVE

## 2019-04-11 LAB — ABO/RH: ABO/RH(D): O POS

## 2019-04-11 LAB — RPR: RPR Ser Ql: NONREACTIVE

## 2019-04-11 SURGERY — Surgical Case
Anesthesia: Epidural

## 2019-04-11 MED ORDER — SENNOSIDES-DOCUSATE SODIUM 8.6-50 MG PO TABS
2.0000 | ORAL_TABLET | ORAL | Status: DC
  Administered 2019-04-12: 2 via ORAL
  Filled 2019-04-11 (×2): qty 2

## 2019-04-11 MED ORDER — SODIUM CHLORIDE (PF) 0.9 % IJ SOLN
INTRAMUSCULAR | Status: DC | PRN
  Administered 2019-04-11: 12 mL/h via EPIDURAL

## 2019-04-11 MED ORDER — LIDOCAINE-EPINEPHRINE (PF) 2 %-1:200000 IJ SOLN
INTRAMUSCULAR | Status: AC
  Filled 2019-04-11: qty 10

## 2019-04-11 MED ORDER — ACETAMINOPHEN 10 MG/ML IV SOLN
1000.0000 mg | Freq: Once | INTRAVENOUS | Status: DC | PRN
  Administered 2019-04-11: 1000 mg via INTRAVENOUS

## 2019-04-11 MED ORDER — ACETAMINOPHEN 500 MG PO TABS
1000.0000 mg | ORAL_TABLET | Freq: Four times a day (QID) | ORAL | Status: AC
  Administered 2019-04-12 (×2): 1000 mg via ORAL
  Filled 2019-04-11 (×4): qty 2

## 2019-04-11 MED ORDER — SIMETHICONE 80 MG PO CHEW
80.0000 mg | CHEWABLE_TABLET | Freq: Three times a day (TID) | ORAL | Status: DC
  Administered 2019-04-12 – 2019-04-13 (×4): 80 mg via ORAL
  Filled 2019-04-11 (×4): qty 1

## 2019-04-11 MED ORDER — DIBUCAINE (PERIANAL) 1 % EX OINT: 1.0000 "application " | TOPICAL_OINTMENT | CUTANEOUS | Status: DC | PRN

## 2019-04-11 MED ORDER — LACTATED RINGERS IV SOLN
INTRAVENOUS | Status: DC
  Administered 2019-04-11: 08:00:00 via INTRAVENOUS

## 2019-04-11 MED ORDER — FENTANYL CITRATE (PF) 100 MCG/2ML IJ SOLN: 25.0000 ug | INTRAMUSCULAR | Status: DC | PRN

## 2019-04-11 MED ORDER — OXYTOCIN 40 UNITS IN NORMAL SALINE INFUSION - SIMPLE MED
1.0000 m[IU]/min | INTRAVENOUS | Status: DC
  Administered 2019-04-11: 2 m[IU]/min via INTRAVENOUS
  Filled 2019-04-11: qty 1000

## 2019-04-11 MED ORDER — OXYTOCIN BOLUS FROM INFUSION: 500.0000 mL | Freq: Once | INTRAVENOUS | Status: DC

## 2019-04-11 MED ORDER — KETOROLAC TROMETHAMINE 30 MG/ML IJ SOLN: 30.0000 mg | Freq: Four times a day (QID) | INTRAMUSCULAR | Status: AC | PRN

## 2019-04-11 MED ORDER — ACETAMINOPHEN 10 MG/ML IV SOLN
INTRAVENOUS | Status: AC
  Filled 2019-04-11: qty 100

## 2019-04-11 MED ORDER — EPHEDRINE 5 MG/ML INJ: 10.0000 mg | INTRAVENOUS | Status: DC | PRN

## 2019-04-11 MED ORDER — LACTATED RINGERS IV SOLN
500.0000 mL | Freq: Once | INTRAVENOUS | Status: AC
  Administered 2019-04-11: 500 mL via INTRAVENOUS

## 2019-04-11 MED ORDER — LACTATED RINGERS IV SOLN: 500.0000 mL | INTRAVENOUS | Status: DC | PRN

## 2019-04-11 MED ORDER — SIMETHICONE 80 MG PO CHEW
80.0000 mg | CHEWABLE_TABLET | ORAL | Status: DC
  Administered 2019-04-12: 80 mg via ORAL
  Filled 2019-04-11 (×2): qty 1

## 2019-04-11 MED ORDER — FENTANYL-BUPIVACAINE-NACL 0.5-0.125-0.9 MG/250ML-% EP SOLN
12.0000 mL/h | EPIDURAL | Status: DC | PRN
  Filled 2019-04-11: qty 250

## 2019-04-11 MED ORDER — ZOLPIDEM TARTRATE 5 MG PO TABS: 5.0000 mg | ORAL_TABLET | Freq: Every evening | ORAL | Status: DC | PRN

## 2019-04-11 MED ORDER — TETANUS-DIPHTH-ACELL PERTUSSIS 5-2.5-18.5 LF-MCG/0.5 IM SUSP: 0.5000 mL | Freq: Once | INTRAMUSCULAR | Status: DC

## 2019-04-11 MED ORDER — DIPHENHYDRAMINE HCL 25 MG PO CAPS: 25.0000 mg | ORAL_CAPSULE | ORAL | Status: DC | PRN

## 2019-04-11 MED ORDER — NALBUPHINE SYRINGE 5 MG/0.5 ML
5.0000 mg | INJECTION | INTRAMUSCULAR | Status: DC | PRN
  Filled 2019-04-11: qty 0.5

## 2019-04-11 MED ORDER — NALOXONE HCL 4 MG/10ML IJ SOLN
1.0000 ug/kg/h | INTRAVENOUS | Status: DC | PRN
  Filled 2019-04-11: qty 5

## 2019-04-11 MED ORDER — OXYTOCIN 40 UNITS IN NORMAL SALINE INFUSION - SIMPLE MED: 2.5000 [IU]/h | INTRAVENOUS | Status: DC

## 2019-04-11 MED ORDER — PHENYLEPHRINE 40 MCG/ML (10ML) SYRINGE FOR IV PUSH (FOR BLOOD PRESSURE SUPPORT)
80.0000 ug | PREFILLED_SYRINGE | INTRAVENOUS | Status: DC | PRN
  Filled 2019-04-11: qty 10

## 2019-04-11 MED ORDER — MORPHINE SULFATE (PF) 0.5 MG/ML IJ SOLN
INTRAMUSCULAR | Status: AC
  Filled 2019-04-11: qty 10

## 2019-04-11 MED ORDER — MORPHINE SULFATE (PF) 0.5 MG/ML IJ SOLN
INTRAMUSCULAR | Status: DC | PRN
  Administered 2019-04-11: 3 mg via EPIDURAL

## 2019-04-11 MED ORDER — OXYTOCIN 40 UNITS IN NORMAL SALINE INFUSION - SIMPLE MED: 2.5000 [IU]/h | INTRAVENOUS | Status: AC

## 2019-04-11 MED ORDER — ONDANSETRON HCL 4 MG/2ML IJ SOLN
4.0000 mg | Freq: Three times a day (TID) | INTRAMUSCULAR | Status: DC | PRN
  Administered 2019-04-11: 4 mg via INTRAVENOUS
  Filled 2019-04-11: qty 2

## 2019-04-11 MED ORDER — LIDOCAINE-EPINEPHRINE (PF) 2 %-1:200000 IJ SOLN
INTRAMUSCULAR | Status: DC | PRN
  Administered 2019-04-11: 2 mL via EPIDURAL
  Administered 2019-04-11 (×2): 5 mL via EPIDURAL
  Administered 2019-04-11: 2 mL via EPIDURAL
  Administered 2019-04-11: 10 mL via EPIDURAL

## 2019-04-11 MED ORDER — SODIUM BICARBONATE 8.4 % IV SOLN
INTRAVENOUS | Status: AC
  Filled 2019-04-11: qty 50

## 2019-04-11 MED ORDER — LACTATED RINGERS IV SOLN
INTRAVENOUS | Status: DC
  Administered 2019-04-11: via INTRAVENOUS

## 2019-04-11 MED ORDER — ONDANSETRON HCL 4 MG/2ML IJ SOLN: 4.0000 mg | Freq: Four times a day (QID) | INTRAMUSCULAR | Status: DC | PRN

## 2019-04-11 MED ORDER — PHENYLEPHRINE 40 MCG/ML (10ML) SYRINGE FOR IV PUSH (FOR BLOOD PRESSURE SUPPORT)
80.0000 ug | PREFILLED_SYRINGE | INTRAVENOUS | Status: DC | PRN
  Administered 2019-04-11 (×2): 80 ug via INTRAVENOUS

## 2019-04-11 MED ORDER — CEFAZOLIN SODIUM-DEXTROSE 2-3 GM-%(50ML) IV SOLR
INTRAVENOUS | Status: DC | PRN
  Administered 2019-04-11: 2 g via INTRAVENOUS

## 2019-04-11 MED ORDER — SOD CITRATE-CITRIC ACID 500-334 MG/5ML PO SOLN
30.0000 mL | ORAL | Status: DC | PRN
  Filled 2019-04-11: qty 30

## 2019-04-11 MED ORDER — ACETAMINOPHEN 325 MG PO TABS: 650.0000 mg | ORAL_TABLET | ORAL | Status: DC | PRN

## 2019-04-11 MED ORDER — TERBUTALINE SULFATE 1 MG/ML IJ SOLN
0.2500 mg | Freq: Once | INTRAMUSCULAR | Status: AC | PRN
  Administered 2019-04-11: 0.25 mg via SUBCUTANEOUS
  Filled 2019-04-11: qty 1

## 2019-04-11 MED ORDER — OXYCODONE HCL 5 MG PO TABS
5.0000 mg | ORAL_TABLET | ORAL | Status: DC | PRN
  Administered 2019-04-12 – 2019-04-13 (×4): 5 mg via ORAL
  Filled 2019-04-11 (×4): qty 1

## 2019-04-11 MED ORDER — LIDOCAINE HCL (PF) 1 % IJ SOLN: 30.0000 mL | INTRAMUSCULAR | Status: DC | PRN

## 2019-04-11 MED ORDER — LIDOCAINE-EPINEPHRINE 2 %-1:100000 IJ SOLN: INTRAMUSCULAR | Status: DC | PRN

## 2019-04-11 MED ORDER — KETOROLAC TROMETHAMINE 30 MG/ML IJ SOLN: 30.0000 mg | Freq: Once | INTRAMUSCULAR | Status: DC | PRN

## 2019-04-11 MED ORDER — SODIUM CHLORIDE 0.9 % IV SOLN
INTRAVENOUS | Status: DC | PRN
  Administered 2019-04-11: 40 [IU] via INTRAVENOUS

## 2019-04-11 MED ORDER — ACETAMINOPHEN 325 MG PO TABS
650.0000 mg | ORAL_TABLET | ORAL | Status: DC | PRN
  Administered 2019-04-12 – 2019-04-13 (×5): 650 mg via ORAL
  Filled 2019-04-11 (×5): qty 2

## 2019-04-11 MED ORDER — ONDANSETRON HCL 4 MG/2ML IJ SOLN
INTRAMUSCULAR | Status: AC
  Filled 2019-04-11: qty 2

## 2019-04-11 MED ORDER — PHENYLEPHRINE 40 MCG/ML (10ML) SYRINGE FOR IV PUSH (FOR BLOOD PRESSURE SUPPORT)
PREFILLED_SYRINGE | INTRAVENOUS | Status: AC
  Filled 2019-04-11: qty 10

## 2019-04-11 MED ORDER — IBUPROFEN 800 MG PO TABS
800.0000 mg | ORAL_TABLET | Freq: Three times a day (TID) | ORAL | Status: DC
  Administered 2019-04-11 – 2019-04-13 (×3): 800 mg via ORAL
  Filled 2019-04-11 (×3): qty 1

## 2019-04-11 MED ORDER — NALBUPHINE SYRINGE 5 MG/0.5 ML
5.0000 mg | INJECTION | Freq: Once | INTRAMUSCULAR | Status: DC | PRN
  Filled 2019-04-11: qty 0.5

## 2019-04-11 MED ORDER — SODIUM CHLORIDE 0.9% FLUSH: 3.0000 mL | INTRAVENOUS | Status: DC | PRN

## 2019-04-11 MED ORDER — ONDANSETRON HCL 4 MG/2ML IJ SOLN
INTRAMUSCULAR | Status: DC | PRN
  Administered 2019-04-11: 4 mg via INTRAVENOUS

## 2019-04-11 MED ORDER — DIPHENHYDRAMINE HCL 50 MG/ML IJ SOLN: 12.5000 mg | INTRAMUSCULAR | Status: DC | PRN

## 2019-04-11 MED ORDER — WITCH HAZEL-GLYCERIN EX PADS: 1.0000 "application " | MEDICATED_PAD | CUTANEOUS | Status: DC | PRN

## 2019-04-11 MED ORDER — SIMETHICONE 80 MG PO CHEW: 80.0000 mg | CHEWABLE_TABLET | ORAL | Status: DC | PRN

## 2019-04-11 MED ORDER — SCOPOLAMINE 1 MG/3DAYS TD PT72: 1.0000 | MEDICATED_PATCH | Freq: Once | TRANSDERMAL | Status: DC

## 2019-04-11 MED ORDER — OXYTOCIN 10 UNIT/ML IJ SOLN
INTRAMUSCULAR | Status: AC
  Filled 2019-04-11: qty 4

## 2019-04-11 MED ORDER — MENTHOL 3 MG MT LOZG: 1.0000 | LOZENGE | OROMUCOSAL | Status: DC | PRN

## 2019-04-11 MED ORDER — CEFAZOLIN SODIUM-DEXTROSE 2-4 GM/100ML-% IV SOLN
INTRAVENOUS | Status: AC
  Filled 2019-04-11: qty 100

## 2019-04-11 MED ORDER — PRENATAL MULTIVITAMIN CH
1.0000 | ORAL_TABLET | Freq: Every day | ORAL | Status: DC
  Administered 2019-04-12: 1 via ORAL
  Filled 2019-04-11: qty 1

## 2019-04-11 MED ORDER — COCONUT OIL OIL
1.0000 "application " | TOPICAL_OIL | Status: DC | PRN
  Administered 2019-04-11: 1 via TOPICAL

## 2019-04-11 MED ORDER — DIPHENHYDRAMINE HCL 25 MG PO CAPS: 25.0000 mg | ORAL_CAPSULE | Freq: Four times a day (QID) | ORAL | Status: DC | PRN

## 2019-04-11 MED ORDER — NALOXONE HCL 0.4 MG/ML IJ SOLN: 0.4000 mg | INTRAMUSCULAR | Status: DC | PRN

## 2019-04-11 MED ORDER — SODIUM CHLORIDE 0.9 % IV SOLN
INTRAVENOUS | Status: DC | PRN
  Administered 2019-04-11: 15:00:00 via INTRAVENOUS

## 2019-04-11 SURGICAL SUPPLY — 34 items
BENZOIN TINCTURE PRP APPL 2/3 (GAUZE/BANDAGES/DRESSINGS) ×2 IMPLANT
CHLORAPREP W/TINT 26ML (MISCELLANEOUS) ×2 IMPLANT
CLAMP CORD UMBIL (MISCELLANEOUS) IMPLANT
CLOTH BEACON ORANGE TIMEOUT ST (SAFETY) ×2 IMPLANT
DRSG OPSITE POSTOP 4X10 (GAUZE/BANDAGES/DRESSINGS) ×2 IMPLANT
ELECT REM PT RETURN 9FT ADLT (ELECTROSURGICAL) ×2
ELECTRODE REM PT RTRN 9FT ADLT (ELECTROSURGICAL) ×1 IMPLANT
EXTRACTOR VACUUM KIWI (MISCELLANEOUS) IMPLANT
EXTRACTOR VACUUM M CUP 4 TUBE (SUCTIONS) IMPLANT
GLOVE BIO SURGEON STRL SZ7 (GLOVE) ×2 IMPLANT
GLOVE BIOGEL PI IND STRL 7.0 (GLOVE) ×2 IMPLANT
GLOVE BIOGEL PI INDICATOR 7.0 (GLOVE) ×2
GOWN STRL REUS W/TWL LRG LVL3 (GOWN DISPOSABLE) ×4 IMPLANT
KIT ABG SYR 3ML LUER SLIP (SYRINGE) IMPLANT
NEEDLE HYPO 25X5/8 SAFETYGLIDE (NEEDLE) IMPLANT
NS IRRIG 1000ML POUR BTL (IV SOLUTION) ×2 IMPLANT
PACK C SECTION WH (CUSTOM PROCEDURE TRAY) ×2 IMPLANT
PAD OB MATERNITY 4.3X12.25 (PERSONAL CARE ITEMS) ×2 IMPLANT
RTRCTR C-SECT PINK 25CM LRG (MISCELLANEOUS) IMPLANT
STRIP CLOSURE SKIN 1/2X4 (GAUZE/BANDAGES/DRESSINGS) ×2 IMPLANT
SUT MNCRL 0 VIOLET CTX 36 (SUTURE) ×2 IMPLANT
SUT MONOCRYL 0 CTX 36 (SUTURE) ×2
SUT PLAIN 0 NONE (SUTURE) IMPLANT
SUT PLAIN 2 0 (SUTURE) ×1
SUT PLAIN ABS 2-0 CT1 27XMFL (SUTURE) ×1 IMPLANT
SUT VIC AB 0 CT1 27 (SUTURE) ×2
SUT VIC AB 0 CT1 27XBRD ANBCTR (SUTURE) ×2 IMPLANT
SUT VIC AB 2-0 CT1 27 (SUTURE) ×1
SUT VIC AB 2-0 CT1 TAPERPNT 27 (SUTURE) ×1 IMPLANT
SUT VIC AB 4-0 KS 27 (SUTURE) ×2 IMPLANT
SUT VICRYL 0 TIES 12 18 (SUTURE) IMPLANT
TOWEL OR 17X24 6PK STRL BLUE (TOWEL DISPOSABLE) ×2 IMPLANT
TRAY FOLEY W/BAG SLVR 14FR LF (SET/KITS/TRAYS/PACK) IMPLANT
WATER STERILE IRR 1000ML POUR (IV SOLUTION) ×2 IMPLANT

## 2019-04-11 NOTE — Anesthesia Procedure Notes (Signed)
Epidural Patient location during procedure: OB Start time: 04/11/2019 1:00 PM End time: 04/11/2019 1:15 PM  Staffing Anesthesiologist: Freddrick March, MD Performed: anesthesiologist   Preanesthetic Checklist Completed: patient identified, pre-op evaluation, timeout performed, IV checked, risks and benefits discussed and monitors and equipment checked  Epidural Patient position: sitting Prep: site prepped and draped and DuraPrep Patient monitoring: continuous pulse ox, blood pressure, heart rate and cardiac monitor Approach: midline Location: L3-L4 Injection technique: LOR air  Needle:  Needle type: Tuohy  Needle gauge: 17 G Needle length: 9 cm Needle insertion depth: 6 cm Catheter type: closed end flexible Catheter size: 19 Gauge Catheter at skin depth: 12 cm Test dose: negative  Assessment Sensory level: T8 Events: blood not aspirated, injection not painful, no injection resistance, negative IV test and no paresthesia  Additional Notes Patient identified. Risks/Benefits/Options discussed with patient including but not limited to bleeding, infection, nerve damage, paralysis, failed block, incomplete pain control, headache, blood pressure changes, nausea, vomiting, reactions to medication both or allergic, itching and postpartum back pain. Confirmed with bedside nurse the patient's most recent platelet count. Confirmed with patient that they are not currently taking any anticoagulation, have any bleeding history or any family history of bleeding disorders. Patient expressed understanding and wished to proceed. All questions were answered. Sterile technique was used throughout the entire procedure. Please see nursing notes for vital signs. Test dose was given through epidural catheter and negative prior to continuing to dose epidural or start infusion. Warning signs of high block given to the patient including shortness of breath, tingling/numbness in hands, complete motor block,  or any concerning symptoms with instructions to call for help. Patient was given instructions on fall risk and not to get out of bed. All questions and concerns addressed with instructions to call with any issues or inadequate analgesia.  Reason for block:procedure for pain

## 2019-04-11 NOTE — Anesthesia Preprocedure Evaluation (Signed)

## 2019-04-11 NOTE — Progress Notes (Signed)
This note also relates to the following rows which could not be included: SpO2 - Cannot attach notes to unvalidated device data  OR notified of potential STAT C/S.

## 2019-04-11 NOTE — Lactation Note (Signed)
This note was copied from a baby's chart. Lactation Consultation Note  Patient Name: Diane Matthews [id number] Date: 04/11/2019 Reason for consult: Initial assessment;Term;Primapara;1st time breastfeeding  P1 mother whose infant is now 60 hours old.  Mother has bilateral  Implants.  Father requested to be the interpreter for mother instead of using the [place] interpreter from [place].  Baby was asleep in mother's arms when I arrived.  Reviewed breast feeding basics with mother.  Mother has short shafted nipples.  The right nipple is reddened and irritated and the left breast has a bruise from an improper latch.  Mother stated that with all the medications she had during labor she could not feel that baby was latched incorrectly.  She will use EBM to rub into nipples/areolas for comfort.  RN will bring coconut oil.    Encouraged to feed baby 8-12 times/24 hours or sooner if he shows feeding cues.  Reviewed cues with parents.  Mother has been shown hand expression and is able to express colostrum drops.  Colostrum container provided and milk storage times reviewed.  Finger feeding demonstrated.    Provided breast shells and hand pump with instructions for use to help evert nipples.  #24 flange size is appropriate at this time.  Mother has been doing a lot of reading about breast feeding and asked some very appropriate questions.  Encouraged her to call for latch assistance from her RN/LC as needed to be sure baby is latching properly.  Mother has a DEBP for home use.  Mom made aware of O/P services, breastfeeding support groups, community resources, and our phone # for post-discharge questions. Both parents are very receptive to learning.  Father supportive.      Maternal Data Formula Feeding for Exclusion: No Has patient been taught Hand Expression?: Yes Does the patient have breastfeeding experience prior to this delivery?: No  Feeding Feeding Type: Breast Fed  LATCH Score Latch:  Repeated attempts needed to sustain latch, nipple held in mouth throughout feeding, stimulation needed to elicit sucking reflex.  Audible Swallowing: A few with stimulation  Type of Nipple: Flat  Comfort (Breast/Nipple): Soft / non-tender  Hold (Positioning): Assistance needed to correctly position infant at breast and maintain latch.  LATCH Score: 6  Interventions Interventions: Breast feeding basics reviewed;Assisted with latch;Skin to skin;Breast massage;Hand express;Reverse pressure;Breast compression;Adjust position;Support pillows  Lactation Tools Discussed/Used Tools: Shells;Pump Shell Type: Inverted Breast pump type: Manual Pump Review: Setup, frequency, and cleaning;Milk Storage Initiated by:: Diane Matthews Date initiated:: 04/11/19   Consult Status Consult Status: Follow-up Date: 04/12/19 Follow-up type: In-patient    Diane Matthews R Diane Matthews 04/11/2019, 8:15 PM

## 2019-04-11 NOTE — Progress Notes (Signed)
This note also relates to the following rows which could not be included: SpO2 - Cannot attach notes to unvalidated device data  Code C/S called.

## 2019-04-11 NOTE — Transfer of Care (Signed)
Immediate Anesthesia Transfer of Care Note  Patient: Diane Matthews  Procedure(s) Performed: CESAREAN SECTION (N/A )  Patient Location: PACU  Anesthesia Type:Epidural  Level of Consciousness: awake, alert  and oriented  Airway & Oxygen Therapy: Patient Spontanous Breathing  Post-op Assessment: Report given to RN and Post -op Vital signs reviewed and stable  Post vital signs: Reviewed and stable  Last Vitals:  Vitals Value Taken Time  BP 195/170 04/11/19 1557  Temp    Pulse 87 04/11/19 1603  Resp 21 04/11/19 1603  SpO2 99 % 04/11/19 1603  Vitals shown include unvalidated device data.  Last Pain:  Vitals:   04/11/19 1410  TempSrc:   PainSc: 5          Complications: No apparent anesthesia complications

## 2019-04-11 NOTE — H&P (Addendum)
Diane Matthews Diane Matthews is a 38 y.o. female presenting for IOL due to favorable cx and maternal age 86 yrs. 39.3 wks  G1, EDC 04/14/19 by 1st trim sono=LMP. AMA, Nl Panorama, Nl AFP1. Nl anatomy, growth nl at 31 wks, 4'7" at 44%, BPD 81% Uncomplicated PNcourse, GBS(-)/ Some general anxiety but easily reassured  OB History    Gravida  1   Para  0   Term  0   Preterm  0   AB  0   Living  0     SAB  0   TAB  0   Ectopic  0   Multiple  0   Live Births  0          Past Medical History:  Diagnosis Date  . AMA (advanced maternal age) primigravida 90+, unspecified trimester   . Bilateral ovarian cysts   . Chronic constipation   . Endometriosis   . Hx of breast implants, bilateral 8563   In [place], Silicone implants   Past Surgical History:  Procedure Laterality Date  . AUGMENTATION MAMMAPLASTY     Silicone implants--[place]   Family History: family history includes Cancer in her paternal grandmother; Diabetes in her maternal grandfather. Social History:  reports that she has never smoked. She has never used smokeless tobacco. She reports current alcohol use of about 2.0 standard drinks of alcohol per week. She reports that she does not use drugs.     Maternal Diabetes: No Genetic Screening: Normal NIPS and AFP1 Maternal Ultrasounds/Referrals: Normal Fetal Ultrasounds or other Referrals:  None Maternal Substance Abuse:  No Significant Maternal Medications:  None Significant Maternal Lab Results:  Group B Strep negative Other Comments:  None  ROS History Dilation: 9 Effacement (%): 90 Station: 0 Exam by:: Limmie Patricia, RNC Blood pressure (!) 114/94, pulse 82, temperature 98.3 F (36.8 C), temperature source Axillary, resp. rate 18, height 5\' 5"  (1.651 m), weight 75.8 kg, last menstrual period 07/09/2018, SpO2 99 %. Exam Physical Exam  Physical exam:  A&O x 3, no acute distress. Pleasant HEENT neg, no thyromegaly Lungs CTA bilat CV RRR, A1S2 normal Abdo  soft, non tender, non acute Extr no edema/ tenderness FHT 140s + accels no decels mod variab- cat I Toco q 3 min, pitocin  SVD 9/100%/-3/ BBOW/ Vx Head was at -3, controlled AROM, clear fluid   Prenatal labs: ABO, Rh: --/--/O POS (09/22 0810) Antibody: NEG (09/22 0810) Rubella: Immune (03/06 0000) RPR: NON REACTIVE (09/22 0828)  HBsAg: Negative (03/06 0000)  HIV: Non-reactive (03/06 0000)  GBS: Negative/-- (08/27 0000)   Assessment/Plan: 38 yo G1, IOL for favorable cervix. GBS(-) Pitocin since admission, progressed well.   FHT cat I  Diane Matthews 04/11/2019, 2:57 PM

## 2019-04-11 NOTE — Progress Notes (Signed)
This note also relates to the following rows which could not be included: SpO2 - Cannot attach notes to unvalidated device data  RN called for additional staff

## 2019-04-11 NOTE — Op Note (Signed)
Cesarean Section Procedure Note   Deshauna Cayson  04/11/2019  Indications: Fetal bradycardia, high station   Pre-operative Diagnosis: Fetal bradycardia, high station for operative vaginal delivery, vaginal bleeding  Post-operative Diagnosis: Same, marginal abruption, nuchal cord, OP position  Surgeon:  Azucena Fallen, MD   Assistants: None   Anesthesia: Epidural   Procedure Details:  The patient was seen in the Labor Room. Fetal bradycardia noted after 2 variable decels with slow recovery, pitocin turned off. But it was followed by bradycardia in 50s, FSE placed, patient was completely dilated but station at -1 and OP. Terbutaline given. Patient was verbally consented and moved to the OR at 11 minutes down. In the OR FHTs were in 100s and hence decision was made for emergency c-section delivery. Team was ready.  Patient was identified as [place] and the procedure verified as C-Section Delivery. A Time Out was held and the above information confirmed.  Epidural was topped off on our way to OR and betadine splashed and the patient was draped and prepped in the usual sterile manner, foley was draining well.  A pfannenstiel incision was made and carried down through the subcutaneous tissue to the fascia. Fascial incision was made with scalpel and stretched. Rectus bellies stretched and peritoneum entered bluntly. Bladder blade placed. A low transverse uterine incision was made without creating bladder flap watch carefully bladder reflection. Small blood clots drained but most of the fluid was clear. Baby was in hyperextended OP position. While attempting to rotate and deliver head, arm extended out of the incision, it was tucked back in and baby delivered cephalic by rotation and flexion and fundal pressure. Birth time 3.14 pm. Tight nuchal cord released at head delivery. Baby was pale and shocky at delivery, so immediate cord clamping done, cut and handed to NICU team in  attendance. Cord gas (ateriral) obtained, then cord blood. Uterine massage and pitocin allowed placental separation, few clots followed consistent with marginal abruption. Placenta was removed complete and uterine exploration revealed clear cavity.  Apgar scores of 4 at one minute and 8 at five minutes. The uterine outline, tubes and ovaries appeared normal}. The uterine incision was closed with running locked sutures of 0Monocryl. A second imbricating layer sutured.  Hemostasis was observed. Peritoneal gutters cleaned. Bladder blade removed. Peritoneal closure done with 2-0 Vicryl.  The fascia was then reapproximated with running sutures of 0Vicryl. The subcuticular closure was performed using 2-0plain gut. The skin was closed with 4-0Vicryl. Steristrips and honeycomb dressing placed.  Instrument, sponge, and needle counts were correct prior the abdominal closure and were correct at the conclusion of the case.   Findings: Marginal abruption, tight nuchal cord, OP position with extended head. Apgars 4 and 8 at 1 and 5 minutes. Cord gas (A) 7.02    Estimated Blood Loss: 450 mL   Total IV Fluids: 1200 cc LR  Urine Output: 250 CC OF pink colored urine but clearing at the end  Specimens: cord gas (a), cord blood, placenta   Complications: no complications  Disposition: PACU - hemodynamically stable.   Maternal Condition: stable   Baby condition / location:  Couplet care / Skin to Skin  Attending Attestation: I performed the procedure.   Signed: Surgeon(s): Azucena Fallen, MD

## 2019-04-12 LAB — CBC
HCT: 31.3 % — ABNORMAL LOW (ref 36.0–46.0)
Hemoglobin: 11.1 g/dL — ABNORMAL LOW (ref 12.0–15.0)
MCH: 34.2 pg — ABNORMAL HIGH (ref 26.0–34.0)
MCHC: 35.5 g/dL (ref 30.0–36.0)
MCV: 96.3 fL (ref 80.0–100.0)
Platelets: 90 10*3/uL — ABNORMAL LOW (ref 150–400)
RBC: 3.25 MIL/uL — ABNORMAL LOW (ref 3.87–5.11)
RDW: 13.3 % (ref 11.5–15.5)
WBC: 12.4 10*3/uL — ABNORMAL HIGH (ref 4.0–10.5)
nRBC: 0 % (ref 0.0–0.2)

## 2019-04-12 MED ORDER — RHO D IMMUNE GLOBULIN 1500 UNIT/2ML IJ SOSY
300.0000 ug | PREFILLED_SYRINGE | Freq: Once | INTRAMUSCULAR | Status: DC
  Filled 2019-04-12: qty 2

## 2019-04-12 NOTE — Progress Notes (Addendum)
Subjective: Postpartum Day 1 Emergency Prim Cesarean Delivery for fetal bradycardia.  BOY. 7'13" at 3.14 pm on 9/22  Breast feeding  Patient reports being tired, anxious from events from yesterday but happy. Pain not much yet. Foley out this AM, void awaited. Lochia moderate. No Dizziness/ CP/ SOB     Objective: Vital signs in last 24 hours: Temp:  [97.1 F (36.2 C)-99.2 F (37.3 C)] 99.1 F (37.3 C) (09/23 0559) Pulse Rate:  [57-98] 89 (09/23 0559) Resp:  [13-30] 16 (09/23 0559) BP: (93-135)/(51-94) 93/54 (09/23 0559) SpO2:  [95 %-100 %] 97 % (09/23 0559)  Physical Exam:  General: alert and cooperative Lochia: appropriate Uterine Fundus: firm Incision: Honeycomb dressing with red stains, no incisional hematoma noted. Will have RN change dressing  DVT Evaluation: No evidence of DVT seen on physical exam.  CBC Latest Ref Rng & Units 04/12/2019 04/11/2019 08/15/2018  WBC 4.0 - 10.5 K/uL 12.4(H) 9.0 8.6  Hemoglobin 12.0 - 15.0 g/dL 11.1(L) 13.4 14.1  Hematocrit 36.0 - 46.0 % 31.3(L) 38.0 41.0  Platelets 150 - 400 K/uL 90(L) 158 257   O+ Rub Immune  Assessment/Plan: Status post Cesarean section. Doing well postoperatively.  Continue current care. Baby BOY- circumcision desired. Reviewed procedure, r/b/c and mother gave written consent. Will return for that after infant bath  Labor event and surgical findings of tight nuchal cord, marginal abruption and OP position reviewed.   Diane Matthews 04/12/2019, 7:21 AM

## 2019-04-12 NOTE — Progress Notes (Signed)
CSW received consult for hx of Anxiety. CSW met with MOB to offer support and complete assessment.    CSW congratulated MOB and FOB on the birth of infant. CSW attempted to utilize translator while in the room for MOB, however FOB reported that he understood and would speak to MOB in their language. MOB agreeable. CSW advised MOB of the reason for the visit and asked if it as okay for FOB to remain in the room during this time. MOB reported that it was. CSW informed MOB of her reported anxiety. MOB and FOB both reported that anxiety was around COVID and MOB's family inability to come over from [place] to see and spend time with baby. MOB reported that this along with COVID made her very sad and increased her fears. CSW validated MOB's feelings and assured her that this is usually normal. MOB reported that she did not have any anxiety prior to pregnancy therefore was never placed on medications or in therapy. MOB disclosed that she has all essential items to care for infant with no other needs at this time.   CSW provided education regarding the baby blues period vs. perinatal mood disorders, discussed treatment and gave resources for mental health follow up if concerns arise.  CSW recommends self-evaluation during the postpartum time period using the New Mom Checklist from Postpartum Progress and encouraged MOB to contact a medical professional if symptoms are noted at any time.   CSW provided review of Sudden Infant Death Syndrome (SIDS) precautions.   CSW identifies no further need for intervention and no barriers to discharge at this time.     Virgie Dad Darla Mcdonald, MSW, LCSW [facility] [phone]

## 2019-04-12 NOTE — Anesthesia Postprocedure Evaluation (Signed)
Anesthesia Post Note  Patient: Diane Matthews  Procedure(s) Performed: CESAREAN SECTION (N/A )     Patient location during evaluation: PACU Anesthesia Type: Epidural Level of consciousness: awake and alert Pain management: pain level controlled Vital Signs Assessment: post-procedure vital signs reviewed and stable Respiratory status: spontaneous breathing, nonlabored ventilation and respiratory function stable Cardiovascular status: stable Postop Assessment: no headache, no backache and epidural receding Anesthetic complications: no    Last Vitals:  Vitals:   04/12/19 1016 04/12/19 1435  BP: 90/62 (!) 94/53  Pulse: 92 85  Resp: 18 16  Temp: 36.8 C 36.9 C  SpO2: 97% 96%    Last Pain:  Vitals:   04/12/19 1435  TempSrc: Oral  PainSc: 2                  Diane Matthews

## 2019-04-13 LAB — CBC
HCT: 29.9 % — ABNORMAL LOW (ref 36.0–46.0)
Hemoglobin: 10 g/dL — ABNORMAL LOW (ref 12.0–15.0)
MCH: 32.7 pg (ref 26.0–34.0)
MCHC: 33.4 g/dL (ref 30.0–36.0)
MCV: 97.7 fL (ref 80.0–100.0)
Platelets: 98 10*3/uL — ABNORMAL LOW (ref 150–400)
RBC: 3.06 MIL/uL — ABNORMAL LOW (ref 3.87–5.11)
RDW: 13.6 % (ref 11.5–15.5)
WBC: 9.3 10*3/uL (ref 4.0–10.5)
nRBC: 0 % (ref 0.0–0.2)

## 2019-04-13 LAB — SURGICAL PATHOLOGY

## 2019-04-13 MED ORDER — OXYCODONE HCL 5 MG PO TABS
5.0000 mg | ORAL_TABLET | ORAL | Status: DC | PRN
  Administered 2019-04-13: 5 mg via ORAL
  Administered 2019-04-13: 10 mg via ORAL
  Filled 2019-04-13: qty 1
  Filled 2019-04-13: qty 2

## 2019-04-13 MED ORDER — SENNOSIDES-DOCUSATE SODIUM 8.6-50 MG PO TABS: 2.0000 | ORAL_TABLET | Freq: Every evening | ORAL | 0 refills | Status: DC | PRN

## 2019-04-13 MED ORDER — POLYSACCHARIDE IRON COMPLEX 150 MG PO CAPS: 150.0000 mg | ORAL_CAPSULE | Freq: Every day | ORAL | Status: DC

## 2019-04-13 MED ORDER — OXYCODONE HCL 5 MG PO TABS: 5.0000 mg | ORAL_TABLET | ORAL | 0 refills | Status: DC | PRN

## 2019-04-13 MED ORDER — IBUPROFEN 800 MG PO TABS: 800.0000 mg | ORAL_TABLET | Freq: Three times a day (TID) | ORAL | 0 refills | Status: DC

## 2019-04-13 MED ORDER — POLYSACCHARIDE IRON COMPLEX 150 MG PO CAPS: 150.0000 mg | ORAL_CAPSULE | Freq: Every day | ORAL | 3 refills | Status: DC

## 2019-04-13 MED ORDER — MAGNESIUM OXIDE 400 (241.3 MG) MG PO TABS: 400.0000 mg | ORAL_TABLET | Freq: Every day | ORAL | Status: DC

## 2019-04-13 NOTE — Progress Notes (Signed)
POSTOPERATIVE DAY # 1 S/P Primary LTCS for fetal bradycardia, baby boy "Bernardo"   S:         Reports incisional pain 8/10 - not well controlled; Motrin held overnight due to thrombocytopenia; platelets improving this AM; pain does improve with Oxy IR. Desires early d/c home today              Tolerating po intake / no nausea / no vomiting / + flatus / no BM  Denies dizziness, SOB, or CP             Bleeding is lighter             Up ad lib / ambulatory/ voiding QS  Newborn breast feeding - going okay; requests lactation nurse / Circumcision - completed    O:  VS: BP (!) 95/50 (BP Location: Left Arm)   Pulse 91   Temp 98.7 F (37.1 C) (Oral)   Resp 16   Ht 5\' 5"  (1.651 m)   Wt 75.8 kg   LMP 07/09/2018   SpO2 98%   Breastfeeding Unknown   BMI 27.81 kg/m    LABS:               Recent Labs    04/12/19 0546 04/13/19 0531  WBC 12.4* 9.3  HGB 11.1* 10.0*  PLT 90* 98*               Bloodtype: --/--/O POS, O POS Performed at [facility], [address], [place], [place]  [phone]/22 0810)  Rubella: Immune (03/06 0000)                                             I&O: Intake/Output      09/23 0701 - 09/24 0700 09/24 0701 - 09/25 0700   P.O.     I.V. (mL/kg)     Other     Total Intake(mL/kg)     Urine (mL/kg/hr) 1000 (0.5)    Blood     Total Output 1000    Net -1000                      Physical Exam:             Alert and Oriented X3  Lungs: Clear and unlabored  Heart: regular rate and rhythm / no murmurs  Abdomen: soft, non-tender, (+) gaseous distention, active bowel sounds in all quadrants             Fundus: firm, non-tender, U-1             Dressing: honeycomb with steri-strips; old dried blood noted on left side of dressing, about 25% of dressing; otherwise dry and intact              Incision:  approximated with sutures / no erythema / no ecchymosis / no drainage  Perineum: intact  Lochia: small rubra  Extremities: trace LE edema, no calf pain  or tenderness,   A:        POD # 2 S/P Primary LTCS            Thrombocytopenia - plts improved today  Hx. Of anxiety - stable, not on meds  ABL Anemia   P:        Routine postoperative care  Okay to resume Motrin  Increase Oxycodone IR 5-10mg  every 4hrs PRN for severe pain  Niferex 150mg  PO daily  Magnesium oxide 400mg  PO daily   Encouraged ambulation and warm liquids, warm shower to promote bowel motility   Per lactation - breastfeeding is going really well for the patient  Okay for discharge home today  WOB discharge book given, instructions and warning s/s reviewed   F/u with Dr. Benjie Karvonen in 6 weeks   Lars Pinks, MSN, [facility]

## 2019-04-13 NOTE — Lactation Note (Signed)
This note was copied from a baby's chart. Lactation Consultation Note  Patient Name: Diane Matthews GYFVC'B Date: 04/13/2019    Mom had a breast augmentation years ago. She reports that prior to surgery, her breasts were smaller, but symmetrical. Mom's breasts are heavier today & feels that infant is doing well with feedings. Mom has compression stripes on both nipples with some bruising on L nipple and at edge of L areola. Upon questioning, parents are not using an asymmetric latch technique. Specifics of an asymmetric latch shown via [employer]. I assisted Mom with the next latch & she had no pain. When infant released the latch, content, Mom's nipple shape was not distorted.   Parents were pleased with their new knowledge. Comfort Gels were provided w/instructions for use. Pumping basics were also discussed (pumping for comfort, if needed). They know how to call us post-discharge.   Diane Matthews [facility] 04/13/2019, 10:58 AM

## 2019-04-13 NOTE — Discharge Summary (Signed)
Obstetric Discharge Summary   Patient Name: Diane Matthews DOB: Dec 13, 1980 MRN: [id number]  Date of Admission: 04/11/2019 Date of Discharge: 04/13/2019 Date of Delivery: 04/11/2019 Gestational Age at Delivery: [redacted]w[redacted]d  Primary OB: [facility] - Dr. Juliene Pina  Antepartum complications:  - AMA - Generalized anxiety  Prenatal Labs:  ABO, Rh: --/--/O POS (09/22 0810) Antibody: NEG (09/22 0810) Rubella: Immune (03/06 0000) RPR: NON REACTIVE (09/22 0828)  HBsAg: Negative (03/06 0000)  HIV: Non-reactive (03/06 0000)  GBS: Negative/-- (08/27 0000)  Admitting Diagnosis: IOL at 39+3 weeks for AMA and favorable cervix   Secondary Diagnoses: Patient Active Problem List   Diagnosis Date Noted  . Postpartum care following cesarean delivery (9/22) 04/12/2019  . Encounter for induction of labor 04/11/2019  . Delivery by emergency cesarean 04/11/2019  . Bilateral ovarian cysts 12/12/2017    Augmentation: Pitocin, AROM  Date of Delivery: 04/11/2019 Delivered By: Dr. Juliene Pina  Delivery Type: primary cesarean section, low transverse incision Anesthesia: epidural  Newborn Data: Live born female  Birth Weight: 7 lb 13.4 oz (3555 g) APGAR: 4, 8  Newborn Delivery   Birth date/time: 04/11/2019 15:14:00 Delivery type: C-Section, Low Transverse Trial of labor: Yes C-section categorization: Primary       Hospital/Postpartum Course  (Cesarean Section):  Pt. Admitted for IOL at 39+3 weeks for AMA and favorable cervix.  She progressed with Pitocin and AROM. She progressed to fully dilated, but baby developed bradycardia and was too high station for operative vaginal delivery.  The decision for emergency cesarean section called.  See notes, operative report for details. Patient had an uncomplicated postpartum course.  By time of discharge on POD#2, her pain was controlled on oral pain medications; she had appropriate lochia and was ambulating, voiding without difficulty, tolerating regular diet  and passing flatus.   She was deemed stable for discharge to home.     Labs: CBC Latest Ref Rng & Units 04/13/2019 04/12/2019 04/11/2019  WBC 4.0 - 10.5 K/uL 9.3 12.4(H) 9.0  Hemoglobin 12.0 - 15.0 g/dL 10.0(L) 11.1(L) 13.4  Hematocrit 36.0 - 46.0 % 29.9(L) 31.3(L) 38.0  Platelets 150 - 400 K/uL 98(L) 90(L) 158   Conflict (See Lab Report): O POS/O POS Performed at [facility], [address], [place], [place] [address]   Physical exam:  BP (!) 95/50 (BP Location: Left Arm)   Pulse 91   Temp 98.7 F (37.1 C) (Oral)   Resp 16   Ht 5\' 5"  (1.651 m)   Wt 75.8 kg   LMP 07/09/2018   SpO2 98%   Breastfeeding Unknown   BMI 27.81 kg/m  General: alert and no distress Pulm: normal respiratory effort Lochia: appropriate Abdomen: soft, NT Uterine Fundus: firm, below umbilicus Perineum: healing well, no significant erythema, no significant edema Incision: c/d/i, healing well, no significant drainage, no dehiscence, no significant erythema Extremities: No evidence of DVT seen on physical exam. Trace lower extremity edema.   Disposition: stable, discharge to home Baby Feeding: breast milk Baby Disposition: home with mom  Rh Immune globulin given: N/A Rubella vaccine given: N/A Tdap vaccine given in AP or PP setting: UTD Flu vaccine given in AP or PP setting: UTD 04/07/2019   Plan:  04/09/2019 was discharged to home in good condition. Follow-up appointment at [facility] in 6 weeks.  Discharge Instructions: Per After Visit Summary. Refer to After Visit Summary and [facility] discharge booklet  Activity: Advance as tolerated. Pelvic rest for 6 weeks.  Diet: Regular, Heart Healthy Discharge Medications: Allergies as of 04/13/2019   No Known Allergies     Medication List    TAKE these medications   acetaminophen 500 MG tablet Commonly known as: TYLENOL Take 500 mg by mouth every 6 (six) hours as needed for mild pain.   calcium carbonate 500 MG  chewable tablet Commonly known as: TUMS - dosed in mg elemental calcium Chew 2 tablets by mouth daily as needed for indigestion or heartburn.   ibuprofen 800 MG tablet Commonly known as: ADVIL Take 1 tablet (800 mg total) by mouth every 8 (eight) hours.   iron polysaccharides 150 MG capsule Commonly known as: NIFEREX Take 1 capsule (150 mg total) by mouth daily.   oxyCODONE 5 MG immediate release tablet Commonly known as: Oxy IR/ROXICODONE Take 1-2 tablets (5-10 mg total) by mouth every 4 (four) hours as needed for moderate pain.   prenatal multivitamin Tabs tablet Take 1 tablet by mouth daily at 12 noon.   senna-docusate 8.6-50 MG tablet Commonly known as: Senokot-S Take 2 tablets by mouth at bedtime as needed for mild constipation.      Outpatient follow up:  Follow-up Information    Azucena Fallen, MD. Schedule an appointment as soon as possible for a visit in 6 week(s).   Specialty: Obstetrics and Gynecology Why: Postpartum visit Contact information: [place] [address] [phone]           Signed:  Lars Pinks, MSN, CNM [facility]

## 2019-05-09 DIAGNOSIS — M5489 Other dorsalgia: Secondary | ICD-10-CM | POA: Diagnosis not present

## 2019-05-22 DIAGNOSIS — Z1151 Encounter for screening for human papillomavirus (HPV): Secondary | ICD-10-CM | POA: Diagnosis not present

## 2019-05-22 DIAGNOSIS — Z124 Encounter for screening for malignant neoplasm of cervix: Secondary | ICD-10-CM | POA: Diagnosis not present

## 2019-05-22 DIAGNOSIS — Z129 Encounter for screening for malignant neoplasm, site unspecified: Secondary | ICD-10-CM | POA: Diagnosis not present

## 2019-06-02 DIAGNOSIS — Z3202 Encounter for pregnancy test, result negative: Secondary | ICD-10-CM | POA: Diagnosis not present

## 2019-07-04 ENCOUNTER — Telehealth: Payer: Self-pay | Admitting: Obstetrics and Gynecology

## 2019-07-04 DIAGNOSIS — N6310 Unspecified lump in the right breast, unspecified quadrant: Secondary | ICD-10-CM

## 2019-07-04 DIAGNOSIS — Z30431 Encounter for routine checking of intrauterine contraceptive device: Secondary | ICD-10-CM | POA: Diagnosis not present

## 2019-07-04 NOTE — Telephone Encounter (Signed)
Call placed to patient, patient will need portuguese interpretor. Advised patient RN will return call with interpretor.   Call placed to patients mobile number using [employer] Interpretor ID # [id number], "Diane Matthews". No answer, unable to leave voicemail. Interpretor was unable to attempt home number, "system will not allow a second call".   New breast imaging orders placed for bilateral Dx MMG with implants and right breast US.

## 2019-07-04 NOTE — Telephone Encounter (Signed)
Please contact patient in follow up to her care.   She came up in my Epic box as overdue for her mammogram follow up.  She had pregnancy during this time and so it was cancelled.   I see that she has delivered and hope she and the baby are doing well!  If she is breast feeding or pumping, she will need to wait 3 months before she can do her follow up breast imaging.  If she has not been breast feeding or pumping for 3 months, she can proceed with scheduling.

## 2019-07-05 NOTE — Telephone Encounter (Signed)
Per review of Epic, patient is scheduled at [facility] for bilateral Dx MMG and right breast US on 07/12/19 at 8:30am.   Routing to Dr. Antony Blackbird.   Encounter closed.

## 2019-07-12 ENCOUNTER — Other Ambulatory Visit: Payer: Self-pay | Admitting: Obstetrics and Gynecology

## 2019-07-12 ENCOUNTER — Ambulatory Visit
Admission: RE | Admit: 2019-07-12 | Discharge: 2019-07-12 | Disposition: A | Discharge status: Home / Self Care | Payer: BC Managed Care – PPO | Source: Ambulatory Visit | Attending: Obstetrics and Gynecology | Admitting: Obstetrics and Gynecology

## 2019-07-12 ENCOUNTER — Other Ambulatory Visit: Payer: Self-pay

## 2019-07-12 DIAGNOSIS — N6310 Unspecified lump in the right breast, unspecified quadrant: Secondary | ICD-10-CM

## 2019-07-12 DIAGNOSIS — N6312 Unspecified lump in the right breast, upper inner quadrant: Secondary | ICD-10-CM | POA: Diagnosis not present

## 2019-07-12 DIAGNOSIS — N6314 Unspecified lump in the right breast, lower inner quadrant: Secondary | ICD-10-CM | POA: Diagnosis not present

## 2019-07-12 DIAGNOSIS — R922 Inconclusive mammogram: Secondary | ICD-10-CM | POA: Diagnosis not present

## 2019-08-28 DIAGNOSIS — Z20828 Contact with and (suspected) exposure to other viral communicable diseases: Secondary | ICD-10-CM | POA: Diagnosis not present

## 2019-08-29 DIAGNOSIS — Z03818 Encounter for observation for suspected exposure to other biological agents ruled out: Secondary | ICD-10-CM | POA: Diagnosis not present

## 2019-08-30 DIAGNOSIS — Z03818 Encounter for observation for suspected exposure to other biological agents ruled out: Secondary | ICD-10-CM | POA: Diagnosis not present

## 2019-08-30 DIAGNOSIS — Z20828 Contact with and (suspected) exposure to other viral communicable diseases: Secondary | ICD-10-CM | POA: Diagnosis not present

## 2019-09-27 DIAGNOSIS — Z3482 Encounter for supervision of other normal pregnancy, second trimester: Secondary | ICD-10-CM | POA: Diagnosis not present

## 2019-09-27 DIAGNOSIS — Z3483 Encounter for supervision of other normal pregnancy, third trimester: Secondary | ICD-10-CM | POA: Diagnosis not present

## 2020-01-09 IMAGING — US US BREAST*R* LIMITED INC AXILLA
1 series · 6 of 6 positions shown · non-contrast
Comparison: Previous exam(s).

CLINICAL DATA: 38-year-old female for near 2 year follow-up of
RIGHT breast mass and INNER LEFT breast asymmetry

EXAM:
DIGITAL DIAGNOSTIC BILATERAL MAMMOGRAM WITH IMPLANTS, CAD AND TOMO
ULTRASOUND BILATERAL BREAST
The patient has retroglandular implants. Standard and implant
displaced views were performed.

[Series 1: us breast*right* limited inc axilla · 0.05mm/px · 6 of 6 slices shown]
[im 1/6]
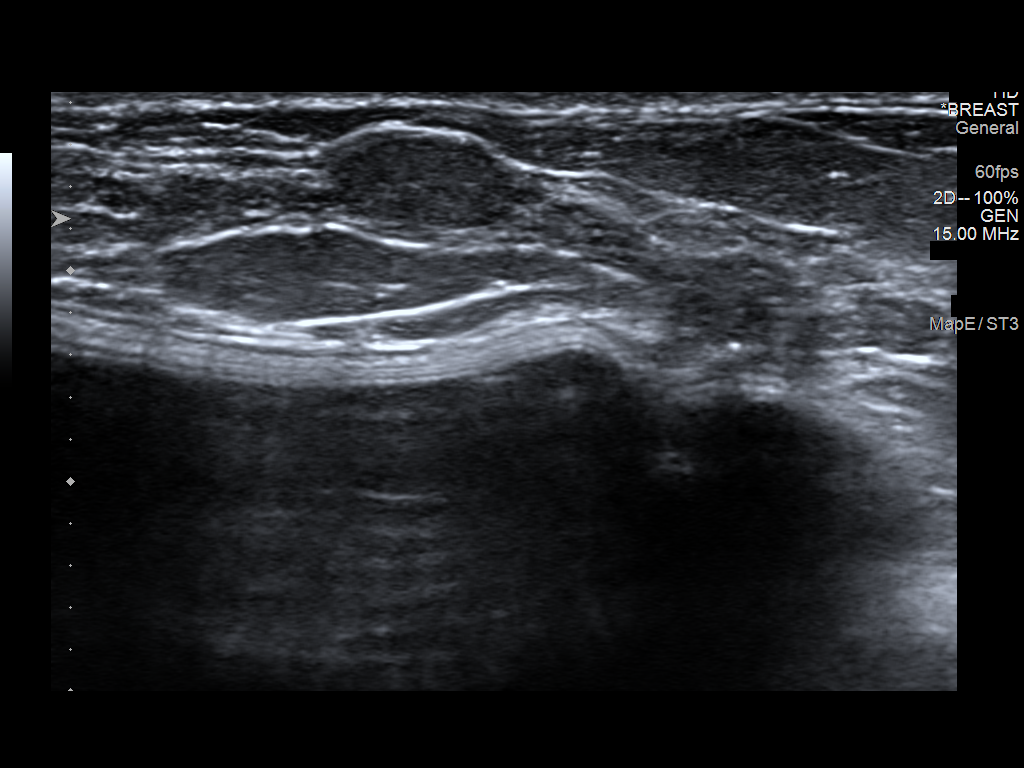
[im 2/6]
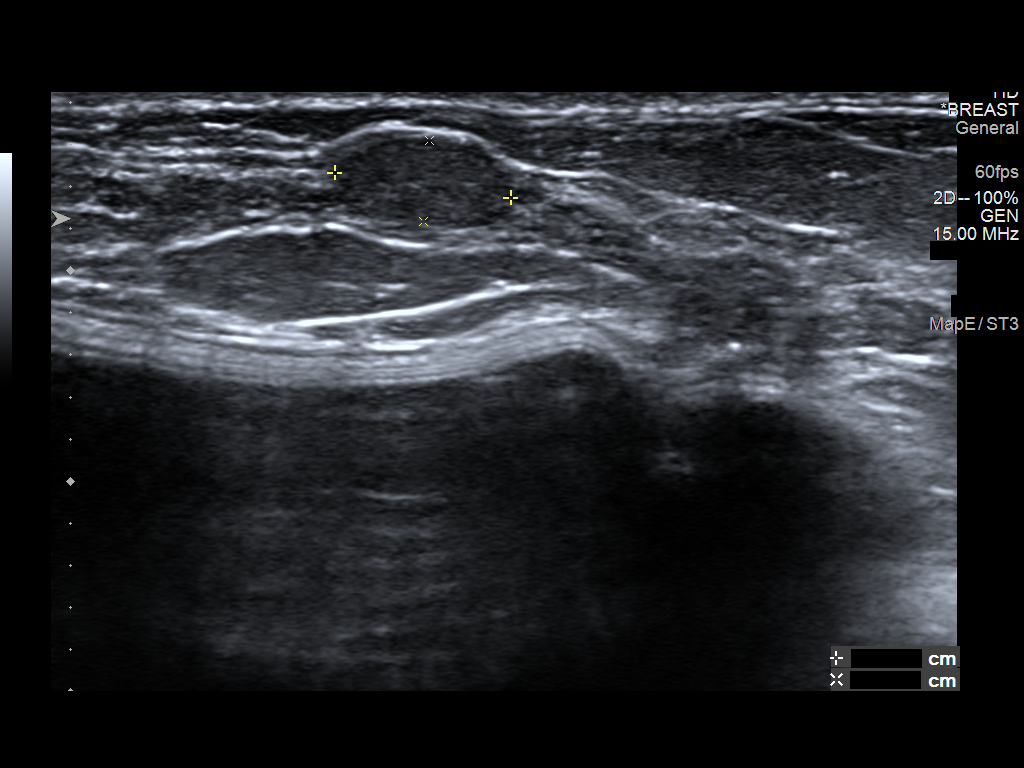
[im 3/6]
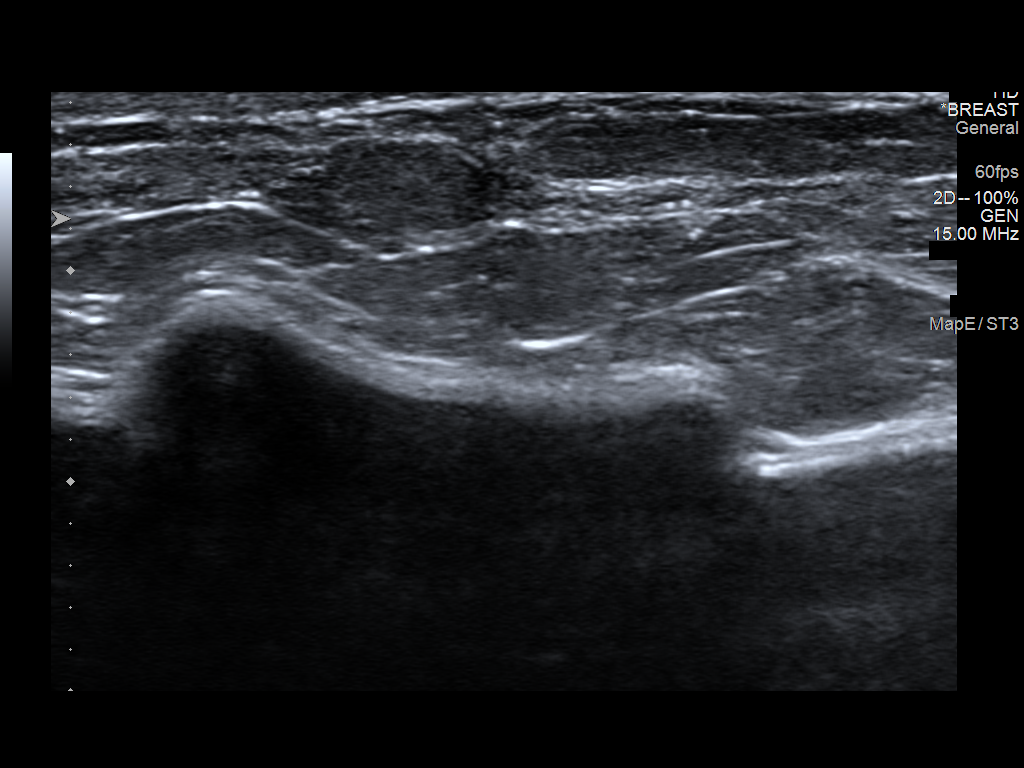
[im 4/6]
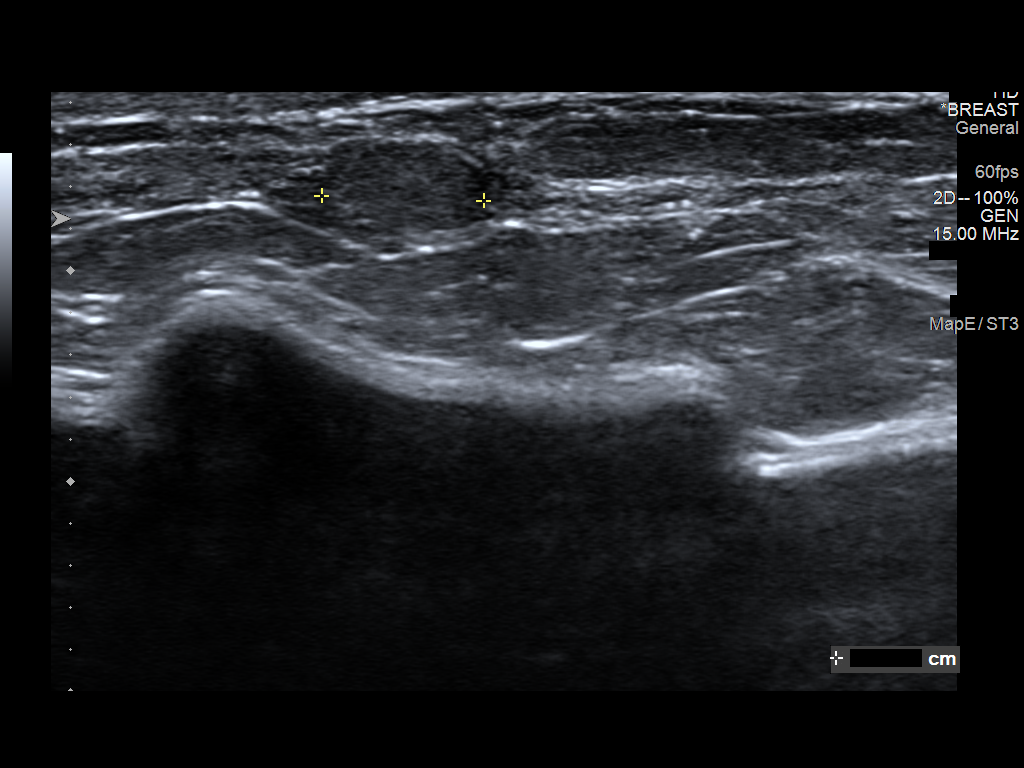
[im 5/6]
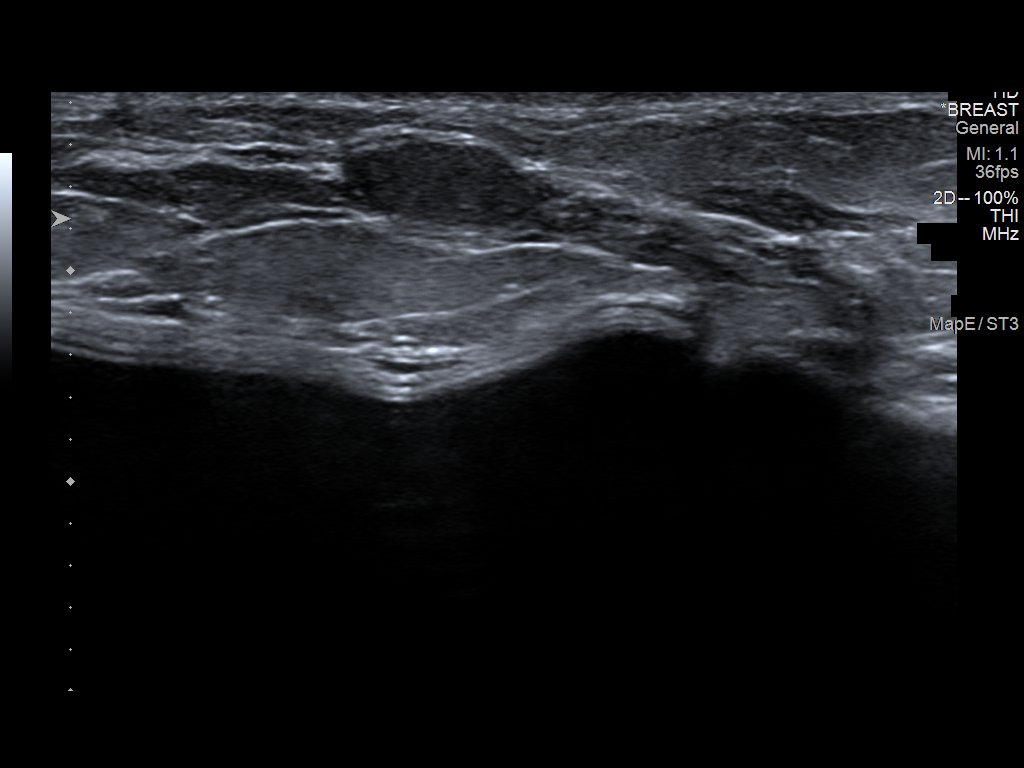
[im 6/6]
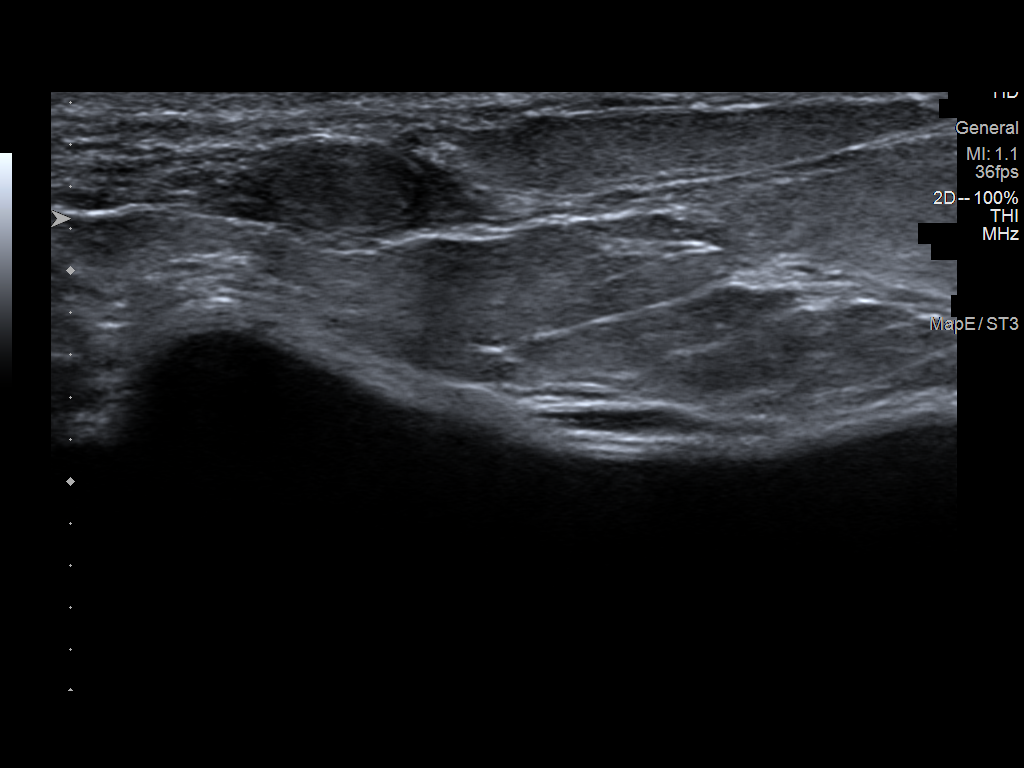

[6 of 6 positions shown; findings below may reference images not displayed]

ACR Breast Density Category c: The breast tissue is heterogeneously
dense, which may obscure small masses.
FINDINGS: 2D/3D full field views of both breasts demonstrate no suspicious
mass, distortion or worrisome calcifications.

Mammographic images were processed with CAD.

Targeted ultrasound is performed, showing a stable 0.8 x 0.4 x
cm circumscribed oval hypoechoic parallel mass at the 3 o'clock
position of the RIGHT breast 3 cm from the nipple, compatible with a
benign mass/probable fibroadenoma.
IMPRESSION: 1. Stable 0.8 cm mass within the INNER RIGHT breast, compatible with
a benign mass/fibroadenoma.
2. No mammographic evidence of breast malignancy.

RECOMMENDATION:
Bilateral screening mammogram at age 40.

I have discussed the findings and recommendations with the patient.
If applicable, a reminder letter will be sent to the patient
regarding the next appointment.

BI-RADS CATEGORY  2: Benign.

## 2020-01-25 ENCOUNTER — Telehealth: Payer: Self-pay

## 2020-01-25 NOTE — Telephone Encounter (Signed)
Last OV 07/2018 for + preg.   Spoke with pt's husband, Diane Matthews. Ok per [employer]. States pt is having depression feelings for the last few months, describes as feeling down.  Pt is post partum from 04/11/2019 Cesarean section delivery. Husband states has not had hx of depression in the past or taken any depression medication. Husband denies pt  is not suicidal or having suicidal thoughts at this time. Pt wanting to possibly have medications to help. Pt had IUD placed after delivery per husband.   Pt requesting to be seen by Dr Edward Jolly. Agreeable to OV. Pt scheduled with Dr Edward Jolly on 7/12 at 2:30pm. Husband and pt agreeable and verbalized understanding.  CPS neg.   Routing to Dr Edward Jolly for review.  Encounter closed.

## 2020-01-25 NOTE — Telephone Encounter (Signed)
Patients husband is calling to schedule an appointment for an exam after patient gave birth.

## 2020-01-29 ENCOUNTER — Encounter: Payer: Self-pay | Admitting: Obstetrics and Gynecology

## 2020-01-29 ENCOUNTER — Other Ambulatory Visit: Payer: Self-pay

## 2020-01-29 ENCOUNTER — Ambulatory Visit: Payer: BC Managed Care – PPO | Admitting: Obstetrics and Gynecology

## 2020-01-29 VITALS — BP 110/60 | HR 84 | Ht 66.0 in | Wt 181.0 lb

## 2020-01-29 DIAGNOSIS — R5383 Other fatigue: Secondary | ICD-10-CM | POA: Diagnosis not present

## 2020-01-29 DIAGNOSIS — N943 Premenstrual tension syndrome: Secondary | ICD-10-CM | POA: Diagnosis not present

## 2020-01-29 MED ORDER — DROSPIRENONE-ETHINYL ESTRADIOL 3-0.02 MG PO TABS
1.0000 | ORAL_TABLET | Freq: Every day | ORAL | 0 refills | Status: DC
Start: 2020-01-29 — End: 2020-02-29

## 2020-01-29 NOTE — Progress Notes (Signed)
GYNECOLOGY  VISIT   HPI: 39 y.o.   Married  [place]  female   [phone] with Patient's last menstrual period was 01/26/2020 (exact date).   here consult regarding moodiness, sadness and fatigue prior to menses since childbirth 03/2019.   She reports decreased exercise tolerance.  She notices increased sadness in her premenstrual period.  Denies suicidal ideation.   She took Prozac in the past and she had a convulsion in [place].  This was many years ago.  She lost control of her bladder when she lost consciousness.   She will start therapy with a counselor.   Had emergency Cesarean Section.  Is at home full time caring for her son, who is doing well.  Not breast feeding.  She is using a copper IUD.  Her flow is heavier now.  She used Mirena in the past and had a low of HA, PMS, cramping and right sided pain.  She took birth control in the past and did not have any problems with this.  She really liked how she felt on this.   She is sleeping well. Concerned about weight gain.   Denies HTN, Smoking and vaping, migraine, liver disease, thromboembolic events.  Known fibroadenoma of right breast.  She has some varicose veins.   Took Covid vaccine in March/Abril.  GYNECOLOGIC HISTORY: Patient's last menstrual period was 01/26/2020 (exact date). Contraception: Paragard IUD 05/2019 Menopausal hormone therapy:  n/a Last mammogram: 07-12-19 Diag.Bil.w/US--Stable 0.8 cm mass within the INNER RIGHT breast, compatible with a benign mass/fibroadenoma. No mammographic evidence of breast malignancy./screening age 55/BiRads2  Last pap smear: 07/2017 normal per patient        OB History    Gravida  1   Para  1   Term  1   Preterm  0   AB  0   Living  1     SAB  0   TAB  0   Ectopic  0   Multiple  0   Live Births  1              Patient Active Problem List   Diagnosis Date Noted  . Postpartum care following cesarean delivery (9/22) 04/12/2019  . Encounter for  induction of labor 04/11/2019  . Delivery by emergency cesarean 04/11/2019  . Bilateral ovarian cysts 12/12/2017    Past Medical History:  Diagnosis Date  . AMA (advanced maternal age) primigravida 35+, unspecified trimester   . Bilateral ovarian cysts   . Chronic constipation   . Endometriosis   . Hx of breast implants, bilateral 2007   In [place], Silicone implants    Past Surgical History:  Procedure Laterality Date  . AUGMENTATION MAMMAPLASTY     Silicone implants--[place]  . CESAREAN SECTION N/A 04/11/2019   Procedure: CESAREAN SECTION;  Surgeon: Shea Evans, MD;  Location: [facility] LD ORS;  Service: Obstetrics;  Laterality: N/A;    No current outpatient medications on file.   No current facility-administered medications for this visit.     ALLERGIES: Prozac [fluoxetine]  Family History  Problem Relation Age of Onset  . Diabetes Maternal Grandfather   . Cancer Paternal Grandmother     Social History   Socioeconomic History  . Marital status: Married    Spouse name: Not on file  . Number of children: Not on file  . Years of education: Not on file  . Highest education level: Not on file  Occupational History  . Not on file  Tobacco Use  .  Smoking status: Never Smoker  . Smokeless tobacco: Never Used  Vaping Use  . Vaping Use: Never used  Substance and Sexual Activity  . Alcohol use: Yes    Alcohol/week: 2.0 standard drinks    Types: 2 Glasses of wine per week  . Drug use: No  . Sexual activity: Yes    Birth control/protection: I.U.D., Pill    Comment: Mirena inseted 04/2015  Other Topics Concern  . Not on file  Social History Narrative  . Not on file   Social Determinants of Health   Financial Resource Strain: Low Risk   . Difficulty of Paying Living Expenses: Not hard at all  Food Insecurity: No Food Insecurity  . Worried About Programme researcher, broadcasting/film/video in the Last Year: Never true  . Ran Out of Food in the Last Year: Never true  Transportation Needs: No  Transportation Needs  . Lack of Transportation (Medical): No  . Lack of Transportation (Non-Medical): No  Physical Activity:   . Days of Exercise per Week:   . Minutes of Exercise per Session:   Stress:   . Feeling of Stress :   Social Connections:   . Frequency of Communication with Friends and Family:   . Frequency of Social Gatherings with Friends and Family:   . Attends Religious Services:   . Active Member of Clubs or Organizations:   . Attends Banker Meetings:   Marland Kitchen Marital Status:   Intimate Partner Violence:   . Fear of Current or Ex-Partner:   . Emotionally Abused:   Marland Kitchen Physically Abused:   . Sexually Abused:     Review of Systems  Constitutional: Positive for fatigue.  Psychiatric/Behavioral:       Moodiness, sadness  All other systems reviewed and are negative.   PHYSICAL EXAMINATION:    BP 110/60 (Cuff Size: Large)   Pulse 84   Ht 5\' 6"  (1.676 m)   Wt 181 lb (82.1 kg)   LMP 01/26/2020 (Exact Date)   Breastfeeding No   BMI 29.21 kg/m     General appearance: alert, cooperative and appears stated age Neck: no adenopathy, supple, symmetrical, trachea midline and thyroid normal to inspection and palpation Lungs: clear to auscultation bilaterally Cor:  S1S2 RRR.  No murmur.   Ext:  Minimal edema, minor varicose veins.   ASSESSMENT  Fatigue.  PMS.  Postpartum depression.  ParaGard IUD.  Varicose veins.  PLAN  We discussed PMS syndrome. Start Yaz.  Will check CMP, CBC, vit D, vit B12, TSH.  She will start counseling.  She declines psychiatry consultation.  Fu in 3 months for annual exam.    40 minute consultation.

## 2020-01-29 NOTE — Patient Instructions (Signed)
Tenso pr-menstrual Premenstrual Syndrome A tenso pr-menstrual (TPM)  um grupo de sintomas fsicos, emocionais e comportamentais que afetam mulheres em idade frtil como parte de seu ciclo menstrual. A TPM comea 1-2 semanas antes do incio do perodo menstrual da mulher e desaparece alguns dias aps o sangramento menstrual comear. Muitas vezes acontece dentro de um padro previsvel (recorrente). A TPM pode fazer com que outras condies de sade piorem, como asma, alergias e enxaqueca. A TPM pode variar de leve a grave. Quando  grave,  chamada de transtorno disfrico pr-menstrual (TDPM). A TPM pode interferir nas atividades normais do dia a dia. Quais so as causas? A causa desse quadro clnico no  conhecida, mas parece estar relacionada a alteraes hormonais que ocorrem antes [employer]. Quais so os sinais ou sintomas? Os sintomas desse quadro clnico geralmente ocorrem todos os meses. Eles desaparecem completamente aps o incio do seu ciclo menstrual. Os sintomas fsicos desse quadro clnico incluem:  Inchao.  Dor na mama.  Dores de cabea.  Fadiga extrema.  Dores nas costas.  Inchao nas mos e nos ps.  Ganho de Salado.  Ondas de calor. Os sintomas emocionais e comportamentais desse quadro incluem:  Alteraes do humor.  Depresso.  Surtos de Advice worker.  Irritabilidade.  Ansiedade.  Crises de choro.  Desejos por certos alimentos ou alteraes no apetite.  Alteraes no desejo sexual.  Confuso.  Agressividade.  Recluso social.  Hollace [place]. Como esse quadro clnico  diagnosticado? Esse quadro pode ser diagnosticado com base em seu histrico e seus sintomas. Esse quadro clnico geralmente  diagnosticado se os sintomas da TPM:  Estiverem presentes nos 5 dias anteriores ao incio do seu ciclo menstrual.  Terminarem dentro de 4 dias aps o incio do seu ciclo menstrual.  Acontecerem pelo menos 3 meses seguidos.  Interferirem em  algumas de suas atividades normais. Outras condies que podem causar alguns desses sintomas devem ser descartadas antes que a TPM possa ser diagnosticada. Inclurem depresso, ansiedade, anemia e problemas de tireoide. Como esse quadro clnico  tratado? Esse quadro clnico pode ser tratado com:  A manuteno de um estilo de vida saudvel. Isso inclui manter uma alimentao saudvel e praticar exerccios fsicos regularmente.  Tomar os remdios. Os medicamentos podem ajudar a aliviar os sintomas, como clicas, dores, desconfortos, enxaqueca e sensibilidade na mama. Mignon condio, seu mdico pode recomendar vrios medicamentos contra a dor vendidos sem receita mdica. Siga essas instrues em casa: Alimentos e bebidas   Tenha uma dieta bem balanceada.  Evite cafena e lcool.  Limite a quantidade de sal e alimentos salgados que voc come. Isso ajudar a reduzir o inchao.  Beba lquidos em quantidade suficiente para manter a urina na cor amarelo-plida.  Tome um multivitamnico, se orientado pelo seu mdico. Estilo de vida   No use nenhum produto que contenha nicotina ou tabaco, como cigarros tradicionais, cigarros eletrnicos e fumo de Higher education careers adviser. Caso precise de ajuda para parar de fumar, fale com seu mdico.  Exercite-se regularmente de acordo com as sugestes do seu mdico.  Durma o suficiente. Para a [facility], so necessrias de 7-8 horas de sono por noite.  Pratique tcnicas de relaxamento como ioga, tai chi ou meditao.  Encontre maneiras saudveis de combater o estresse. Instrues gerais   Durante 2-3 meses, anote seus sintomas, a gravidade e quanto tempo eles duram. Isso ajudar o mdico a escolher o melhor tratamento para voc.  Tome medicamentos vendidos com ou sem receita mdica somente de acordo com as  indicaes do seu mdico.  Se voc estiver tomando plula anticoncepcional (contraceptivos orais), use-a de acordo com as  orientaes do seu mdico. Entre em contato com um mdico se:  Seus sintomas piorarem.  Apresentar novos sintomas.  Tiver dificuldade de Optometrist suas atividades dirias. Resumo  A tenso pr-menstrual (TPM)  um grupo de sintomas fsicos, emocionais e comportamentais que afetam mulheres em idade frtil.  A TPM comea 1-2 semanas antes do incio do ciclo menstrual da mulher e desaparece alguns dias aps o ciclo comear.  A TPM  tratada mantendo-se um estilo de vida saudvel e tomando medicamentos para aliviar os sintomas. Estas informaes no se destinam a substituir as recomendaes de seu mdico. No deixe de discutir quaisquer dvidas com seu mdico. Document Revised: 03/15/2018 Document Reviewed: 03/15/2018 Elsevier Patient Education  2020 [employer].

## 2020-01-30 LAB — COMPREHENSIVE METABOLIC PANEL
ALT: 9 IU/L (ref 0–32)
AST: 12 IU/L (ref 0–40)
Albumin/Globulin Ratio: 1.7 (ref 1.2–2.2)
Albumin: 4.6 g/dL (ref 3.8–4.8)
Alkaline Phosphatase: 67 IU/L (ref 48–121)
BUN/Creatinine Ratio: 20 (ref 9–23)
BUN: 14 mg/dL (ref 6–20)
Bilirubin Total: 0.3 mg/dL (ref 0.0–1.2)
CO2: 22 mmol/L (ref 20–29)
Calcium: 9.7 mg/dL (ref 8.7–10.2)
Chloride: 102 mmol/L (ref 96–106)
Creatinine, Ser: 0.7 mg/dL (ref 0.57–1.00)
GFR calc Af Amer: 127 mL/min/{1.73_m2} (ref >59)
GFR calc non Af Amer: 110 mL/min/{1.73_m2} (ref >59)
Globulin, Total: 2.7 g/dL (ref 1.5–4.5)
Glucose: 81 mg/dL (ref 65–99)
Potassium: 4.2 mmol/L (ref 3.5–5.2)
Sodium: 138 mmol/L (ref 134–144)
Total Protein: 7.3 g/dL (ref 6.0–8.5)

## 2020-01-30 LAB — CBC
Hematocrit: 40.1 % (ref 34.0–46.6)
Hemoglobin: 13.2 g/dL (ref 11.1–15.9)
MCH: 30 pg (ref 26.6–33.0)
MCHC: 32.9 g/dL (ref 31.5–35.7)
MCV: 91 fL (ref 79–97)
Platelets: 284 10*3/uL (ref 150–450)
RBC: 4.4 x10E6/uL (ref 3.77–5.28)
RDW: 13.1 % (ref 11.7–15.4)
WBC: 6.9 10*3/uL (ref 3.4–10.8)

## 2020-01-30 LAB — VITAMIN B12: Vitamin B-12: 390 pg/mL (ref 232–1245)

## 2020-01-30 LAB — VITAMIN D 25 HYDROXY (VIT D DEFICIENCY, FRACTURES): Vit D, 25-Hydroxy: 27.7 ng/mL — ABNORMAL LOW (ref 30.0–100.0)

## 2020-01-30 LAB — TSH: TSH: 2.05 u[IU]/mL (ref 0.450–4.500)

## 2020-02-27 ENCOUNTER — Telehealth: Payer: Self-pay | Admitting: Obstetrics and Gynecology

## 2020-02-27 NOTE — Telephone Encounter (Signed)
OV 01/29/20 H/o PMS, fatigue  H/o headaches with previous contraception  Spoke with pt's husband Rulon Sera. Ok per [employer].  States pt having longer than normal cycle and heavier bleeding this month. Pt is currently using Yax Rx since last OV with Dr Edward Jolly in July.Denies any skipped or missed pills. Pt had C/S last 03/2019. LMP 8/2 and still going.   Husband unsure how often wife changing pads/tampons. Denies feeling weak or dizzy. Husband states pt is having headaches around cycles. Pt is not taking any OTC meds at this time to help with headaches. Husband unsure about abd cramps or clots.  Husband advised to have pt be seen with Dr Edward Jolly. Agreeable. Pt scheduled with Dr Edward Jolly for OV on 8/12 at 930 am. Agreeable and verbalized understanding of date and time of appt.   Encounter closed

## 2020-02-27 NOTE — Telephone Encounter (Signed)
Patient's spouse Diane Matthews called regarding his wife's "missed periods" and would like an appointment. DPR in file to talk with spouse.

## 2020-02-28 NOTE — Progress Notes (Signed)
GYNECOLOGY  VISIT   HPI: 39 y.o.   Married  [place]  female   [phone] with Patient's last menstrual period was 02/10/2020 (exact date).   here for irregular menses.   Portuguese interpretor Linton Ham present for the consultation.   Patient states began bleeding on 02-10-20 and bled through until 02-27-20. This was not regular time for menses. LNMP 01-26-20.  Patient seen for postpartum depression without suicidal ideation, fatigue, hx PCOS, and PMS on 01/29/20.  She has a ParaGard IUD.  She started Yaz birth control on 01/29/20 to treat PMS.  Her fatigue is improved.  Sleeping well.  Increased appetite.  No missed pills.  No pelvic pain.  She had a convulsion in the past when she took a friend's Prozac in [place].  She is doing counseling on line.  Has a potential psychiatrist in [place].  She has declined a referral to psychiatry at her visit on 712/21.  Normal labs reviewed from 01/29/20.  Some TMJ.   Going to [place] for one week in September. Going to [place] in October, November and stay for several months.   UPT:Neg  GYNECOLOGIC HISTORY: Patient's last menstrual period was 02/10/2020 (exact date). Contraception: Paragard 05/2019, Yaz Menopausal hormone therapy:  n/a Last mammogram: 07-12-19 Diag.Bil.w/US--Stable 0.8 cm mass within the INNER RIGHT breast, compatible with a benign mass/fibroadenoma. No mammographic evidence of breast malignancy./screening age 33/BiRads2 Last pap smear: 07/2017 normal per patient        OB History    Gravida  1   Para  1   Term  1   Preterm  0   AB  0   Living  1     SAB  0   TAB  0   Ectopic  0   Multiple  0   Live Births  1              Patient Active Problem List   Diagnosis Date Noted  . Postpartum care following cesarean delivery (9/22) 04/12/2019  . Encounter for induction of labor 04/11/2019  . Delivery by emergency cesarean 04/11/2019  . Bilateral ovarian cysts 12/12/2017    Past Medical History:   Diagnosis Date  . AMA (advanced maternal age) primigravida 35+, unspecified trimester   . Bilateral ovarian cysts   . Chronic constipation   . Endometriosis   . Hx of breast implants, bilateral 2007   In [place], Silicone implants    Past Surgical History:  Procedure Laterality Date  . AUGMENTATION MAMMAPLASTY     [place]one implants--[place]  . CESAREAN SECTION N/A 04/11/2019   Procedure: CESAREAN SECTION;  Surgeon: Shea Evans, MD;  [facility]: [facility];  Service: Obstetrics;  Laterality: N/A;    Current Outpatient Medications  Medication Sig Dispense Refill  . drospirenone-ethinyl estradiol (YAZ) 3-0.02 MG tablet Take 1 tablet by mouth daily. 3 tablet 0   No current facility-administered medications for this visit.     ALLERGIES: Prozac [fluoxetine]  Family History  Problem Relation Age of Onset  . Diabetes Maternal Grandfather   . Cancer Paternal Grandmother     Social History   Socioeconomic History  . Marital status: Married    Spouse name: Not on file  . Number of children: Not on file  . Years of education: Not on file  . Highest education level: Not on file  Occupational History  . Not on file  Tobacco Use  . Smoking status: Never Smoker  . Smokeless tobacco: Never Used  Vaping Use  .  Vaping Use: Never used  Substance and Sexual Activity  . Alcohol use: Yes    Alcohol/week: 2.0 standard drinks    Types: 2 Glasses of wine per week  . Drug use: No  . Sexual activity: Yes    Birth control/protection: I.U.D., Pill    Comment: Mirena inseted 04/2015  Other Topics Concern  . Not on file  Social History Narrative  . Not on file   Social Determinants of Health   Financial Resource Strain: Low Risk   . Difficulty of Paying Living Expenses: Not hard at all  Food Insecurity: No Food Insecurity  . Worried About Programme researcher, broadcasting/film/video in the Last Year: Never true  . Ran Out of Food in the Last Year: Never true  Transportation Needs: No Transportation Needs   . Lack of Transportation (Medical): No  . Lack of Transportation (Non-Medical): No  Physical Activity:   . Days of Exercise per Week:   . Minutes of Exercise per Session:   Stress:   . Feeling of Stress :   Social Connections:   . Frequency of Communication with Friends and Family:   . Frequency of Social Gatherings with Friends and Family:   . Attends Religious Services:   . Active Member of Clubs or Organizations:   . Attends Banker Meetings:   Marland Kitchen Marital Status:   Intimate Partner Violence:   . Fear of Current or Ex-Partner:   . Emotionally Abused:   Marland Kitchen Physically Abused:   . Sexually Abused:     Review of Systems  All other systems reviewed and are negative.   PHYSICAL EXAMINATION:    BP 100/64   Pulse 76   Ht 5\' 6"  (1.676 m)   Wt 183 lb (83 kg)   LMP 02/10/2020 (Exact Date)   BMI 29.54 kg/m     General appearance: alert, cooperative and appears stated age   Pelvic: External genitalia:  no lesions              Urethra:  normal appearing urethra with no masses, tenderness or lesions              Bartholins and Skenes: normal                 Vagina: normal appearing vagina with normal color and discharge, no lesions              Cervix: no lesions.  IUD strings noted.  No bleeding.                Bimanual Exam:  Uterus:  normal size, contour, position, consistency, mobility, non-tender              Adnexa: no mass, fullness, tenderness      Chaperone was present for exam.  ASSESSMENT  Irregular bleeding on Yaz.  ParaGard IUD. Postpartum depression and anxiety.  Fatigue improved.  Life stress.   PLAN  We discussed her PCOS and no need to treat with hormones if she is cycling at least every 2 months.  Options for bleeding:  Continued COCs, pelvic 02/12/2020, or stop pills.  She wishes to stop the COCs.  She will pursue psychiatry consultation when she travels to [place] in September.  Reassurance that her IUD is in a normal position.  FU prn.

## 2020-02-29 ENCOUNTER — Other Ambulatory Visit: Payer: Self-pay

## 2020-02-29 ENCOUNTER — Ambulatory Visit: Payer: BC Managed Care – PPO | Admitting: Obstetrics and Gynecology

## 2020-02-29 ENCOUNTER — Encounter: Payer: Self-pay | Admitting: Obstetrics and Gynecology

## 2020-02-29 VITALS — BP 100/64 | HR 76 | Ht 66.0 in | Wt 183.0 lb

## 2020-02-29 DIAGNOSIS — N926 Irregular menstruation, unspecified: Secondary | ICD-10-CM

## 2020-02-29 DIAGNOSIS — E282 Polycystic ovarian syndrome: Secondary | ICD-10-CM

## 2020-02-29 DIAGNOSIS — N943 Premenstrual tension syndrome: Secondary | ICD-10-CM

## 2020-02-29 DIAGNOSIS — R5383 Other fatigue: Secondary | ICD-10-CM

## 2020-02-29 LAB — POCT URINE PREGNANCY: Preg Test, Ur: NEGATIVE

## 2020-04-01 DIAGNOSIS — Z20822 Contact with and (suspected) exposure to covid-19: Secondary | ICD-10-CM | POA: Diagnosis not present

## 2020-04-19 ENCOUNTER — Other Ambulatory Visit: Payer: Self-pay | Admitting: Obstetrics and Gynecology

## 2020-04-30 ENCOUNTER — Ambulatory Visit: Payer: BC Managed Care – PPO | Admitting: Obstetrics and Gynecology

## 2020-10-15 ENCOUNTER — Ambulatory Visit: Payer: BC Managed Care – PPO | Admitting: Obstetrics and Gynecology

## 2020-10-15 ENCOUNTER — Encounter: Payer: Self-pay | Admitting: Obstetrics and Gynecology

## 2020-10-15 ENCOUNTER — Other Ambulatory Visit: Payer: Self-pay

## 2020-10-15 VITALS — BP 122/70 | HR 68 | Ht 65.0 in | Wt 185.0 lb

## 2020-10-15 DIAGNOSIS — Z01419 Encounter for gynecological examination (general) (routine) without abnormal findings: Secondary | ICD-10-CM | POA: Diagnosis not present

## 2020-10-15 DIAGNOSIS — R3989 Other symptoms and signs involving the genitourinary system: Secondary | ICD-10-CM

## 2020-10-15 LAB — URINALYSIS, COMPLETE W/RFL CULTURE
Bacteria, UA: NONE SEEN /HPF
Bilirubin Urine: NEGATIVE
Glucose, UA: NEGATIVE
Hyaline Cast: NONE SEEN /LPF
Ketones, ur: NEGATIVE
Leukocyte Esterase: NEGATIVE
Nitrites, Initial: NEGATIVE
Protein, ur: NEGATIVE
RBC / HPF: NONE SEEN /HPF (ref 0–2)
Specific Gravity, Urine: 1.02 (ref 1.001–1.03)
WBC, UA: NONE SEEN /HPF (ref 0–5)
pH: 5.5 (ref 5.0–8.0)

## 2020-10-15 LAB — LIPID PANEL
Cholesterol: 139 mg/dL (ref <200)
HDL: 52 mg/dL (ref >=50)
LDL Cholesterol (Calc): 71 mg/dL (calc)
Non-HDL Cholesterol (Calc): 87 mg/dL (calc) (ref <130)
Total CHOL/HDL Ratio: 2.7 (calc) (ref <5.0)
Triglycerides: 81 mg/dL (ref <150)

## 2020-10-15 LAB — NO CULTURE INDICATED

## 2020-10-15 MED ORDER — IBUPROFEN 800 MG PO TABS
800.0000 mg | ORAL_TABLET | Freq: Three times a day (TID) | ORAL | 0 refills | Status: DC | PRN
Start: 2020-10-15 — End: 2021-12-01

## 2020-10-15 NOTE — Progress Notes (Signed)
40 y.o. [id number] Married [place] female here for annual exam.    Heavy menstrual flow for 2 days.   Difficulty controlling her urine.  Some bladder discomfort.  Recent UTI, finished treating one month.   Living in [place] and will return in 3 months.  Son is 60.9 years old.  She will do a psychotherapy treatment when she returns to the [place].  PCP:  None   Patient's last menstrual period was 09/13/2020 (exact date).     Period Cycle (Days): 30 Period Duration (Days): 4-6 Period Pattern: Regular Menstrual Flow: Heavy Menstrual Control: Tampon,Maxi pad Menstrual Control Change Freq (Hours): changes pad/tampon every 3 hours on heaviest day Dysmenorrhea: None     Sexually active: Yes.    The current method of family planning is IUD--Paragard 05/2019.    Exercising: No.  The patient does not participate in regular exercise at present. Smoker:  no  Health Maintenance: Pap: 07/2017 normal per patient History of abnormal Pap:  no MMG: 07-12-19 Diag.Bil.w/US--Stable 0.8 cm mass within the INNER RIGHT breast, compatible with a benign mass/fibroadenoma. No mammographic evidence of breast malignancy./screening age 36/BiRads2 Colonoscopy:  n/a BMD:   n/a  Result  n/a TDaP: 07-12-18 Gardasil:   no HIV:no Hep C:no Screening Labs:  Lipids today.    reports that she has never smoked. She has never used smokeless tobacco. She reports current alcohol use of about 2.0 standard drinks of alcohol per week. She reports that she does not use drugs.  Past Medical History:  Diagnosis Date  . AMA (advanced maternal age) primigravida 35+, unspecified trimester   . Bilateral ovarian cysts   . Chronic constipation   . Endometriosis   . Hx of breast implants, bilateral 2007   In [place], Silicone implants    Past Surgical History:  Procedure Laterality Date  . AUGMENTATION MAMMAPLASTY     Silicone implants--[place]  . CESAREAN SECTION N/A 04/11/2019   Procedure: CESAREAN SECTION;  Surgeon:  Shea Evans, MD;  Location: [facility];  Service: Obstetrics;  Laterality: N/A;    Current Outpatient Medications  Medication Sig Dispense Refill  . paragard intrauterine copper IUD IUD 1 each by Intrauterine route once.     No current facility-administered medications for this visit.    Family History  Problem Relation Age of Onset  . Diabetes Maternal Grandfather   . Cancer Paternal Grandmother     Review of Systems  All other systems reviewed and are negative.   Exam:   BP 122/70 (Cuff Size: Large)   Pulse 68   Ht 5\' 5"  (1.651 m)   Wt 185 lb (83.9 kg)   LMP 09/13/2020 (Exact Date)   SpO2 99%   BMI 30.79 kg/m     General appearance: alert, cooperative and appears stated age Head: normocephalic, without obvious abnormality, atraumatic Neck: no adenopathy, supple, symmetrical, trachea midline and thyroid normal to inspection and palpation Lungs: clear to auscultation bilaterally Breasts: bilateral augmentation, no masses or tenderness, No nipple retraction or dimpling, No nipple discharge or bleeding, No axillary adenopathy Heart: regular rate and rhythm Abdomen: soft, non-tender; no masses, no organomegaly Extremities: extremities normal, atraumatic, no cyanosis or edema Skin: skin color, texture, turgor normal. No rashes or lesions Lymph nodes: cervical, supraclavicular, and axillary nodes normal. Neurologic: grossly normal  Pelvic: External genitalia:  no lesions              No abnormal inguinal nodes palpated.  Urethra:  normal appearing urethra with no masses, tenderness or lesions              Bartholins and Skenes: normal                 Vagina: normal appearing vagina with normal color and discharge, no lesions              Cervix: no lesions.  IUD strings seen.               Pap taken: No. Bimanual Exam:  Uterus:  normal size, contour, position, consistency, mobility, non-tender              Adnexa: no mass, fullness, tenderness                 Chaperone was present for exam.  Assessment:   Well woman visit with normal exam. Paragard IUD.  Heavy menstrual flow.  Status post bilateral breast augmentation.  Hx PP depression and anxiety. Bladder pain.   Plan: Mammogram screening discussed. Self breast awareness reviewed. Pap and reflex HPV not needed. Guidelines for Calcium, Vitamin D, regular exercise program including cardiovascular and weight bearing exercise. Motrin 800 mg every 8 hour prn.   Urinalysis and reflex culture. Lipid profile. Follow up annually and prn.

## 2020-10-17 NOTE — Patient Instructions (Signed)

## 2020-11-19 ENCOUNTER — Other Ambulatory Visit: Payer: Self-pay | Admitting: Obstetrics and Gynecology

## 2020-11-19 NOTE — Telephone Encounter (Signed)
Medication refill request: ibuprofen 800mg  Last AEX:  10-15-2020 Next AEX: not scheduled Last MMG (if hormonal medication request): n/a Refill authorized: last refilled 10-15-2020 100 pills with 0 refills. Please approve or deny as appropriate.

## 2021-05-31 DIAGNOSIS — Z23 Encounter for immunization: Secondary | ICD-10-CM | POA: Diagnosis not present

## 2021-06-27 DIAGNOSIS — N390 Urinary tract infection, site not specified: Secondary | ICD-10-CM | POA: Diagnosis not present

## 2021-06-27 DIAGNOSIS — F419 Anxiety disorder, unspecified: Secondary | ICD-10-CM | POA: Diagnosis not present

## 2021-06-27 DIAGNOSIS — R300 Dysuria: Secondary | ICD-10-CM | POA: Diagnosis not present

## 2021-06-27 DIAGNOSIS — Z0000 Encounter for general adult medical examination without abnormal findings: Secondary | ICD-10-CM | POA: Diagnosis not present

## 2021-06-27 DIAGNOSIS — Z13220 Encounter for screening for lipoid disorders: Secondary | ICD-10-CM | POA: Diagnosis not present

## 2021-07-28 DIAGNOSIS — G8929 Other chronic pain: Secondary | ICD-10-CM | POA: Diagnosis not present

## 2021-07-28 DIAGNOSIS — R300 Dysuria: Secondary | ICD-10-CM | POA: Diagnosis not present

## 2021-07-28 DIAGNOSIS — M25561 Pain in right knee: Secondary | ICD-10-CM | POA: Diagnosis not present

## 2021-07-28 DIAGNOSIS — F419 Anxiety disorder, unspecified: Secondary | ICD-10-CM | POA: Diagnosis not present

## 2021-08-12 DIAGNOSIS — M25561 Pain in right knee: Secondary | ICD-10-CM | POA: Diagnosis not present

## 2021-08-20 ENCOUNTER — Other Ambulatory Visit: Payer: Self-pay

## 2021-08-20 ENCOUNTER — Other Ambulatory Visit: Payer: Self-pay | Admitting: Sports Medicine

## 2021-08-20 ENCOUNTER — Ambulatory Visit
Admission: RE | Admit: 2021-08-20 | Discharge: 2021-08-20 | Disposition: A | Discharge status: Home / Self Care | Payer: BC Managed Care – PPO | Source: Ambulatory Visit | Attending: Sports Medicine | Admitting: Sports Medicine

## 2021-08-20 DIAGNOSIS — M25561 Pain in right knee: Secondary | ICD-10-CM

## 2021-09-10 DIAGNOSIS — Z0289 Encounter for other administrative examinations: Secondary | ICD-10-CM

## 2021-09-23 DIAGNOSIS — M25561 Pain in right knee: Secondary | ICD-10-CM | POA: Diagnosis not present

## 2021-10-01 ENCOUNTER — Ambulatory Visit (INDEPENDENT_AMBULATORY_CARE_PROVIDER_SITE_OTHER): Payer: BC Managed Care – PPO | Admitting: Family Medicine

## 2021-10-01 ENCOUNTER — Encounter (INDEPENDENT_AMBULATORY_CARE_PROVIDER_SITE_OTHER): Payer: Self-pay | Admitting: Family Medicine

## 2021-10-01 ENCOUNTER — Other Ambulatory Visit: Payer: Self-pay

## 2021-10-01 VITALS — BP 97/64 | HR 70 | Temp 98.1°F | Ht 66.0 in | Wt 189.0 lb

## 2021-10-01 DIAGNOSIS — R5383 Other fatigue: Secondary | ICD-10-CM

## 2021-10-01 DIAGNOSIS — R0602 Shortness of breath: Secondary | ICD-10-CM

## 2021-10-01 DIAGNOSIS — Z1331 Encounter for screening for depression: Secondary | ICD-10-CM | POA: Diagnosis not present

## 2021-10-01 DIAGNOSIS — E538 Deficiency of other specified B group vitamins: Secondary | ICD-10-CM | POA: Diagnosis not present

## 2021-10-01 DIAGNOSIS — F419 Anxiety disorder, unspecified: Secondary | ICD-10-CM

## 2021-10-01 DIAGNOSIS — E669 Obesity, unspecified: Secondary | ICD-10-CM | POA: Diagnosis not present

## 2021-10-01 DIAGNOSIS — E559 Vitamin D deficiency, unspecified: Secondary | ICD-10-CM | POA: Diagnosis not present

## 2021-10-01 DIAGNOSIS — Z9189 Other specified personal risk factors, not elsewhere classified: Secondary | ICD-10-CM

## 2021-10-01 DIAGNOSIS — Z6830 Body mass index (BMI) 30.0-30.9, adult: Secondary | ICD-10-CM

## 2021-10-02 LAB — COMPREHENSIVE METABOLIC PANEL
ALT: 11 IU/L (ref 0–32)
AST: 10 IU/L (ref 0–40)
Albumin/Globulin Ratio: 1.8 (ref 1.2–2.2)
Albumin: 4.6 g/dL (ref 3.8–4.8)
Alkaline Phosphatase: 73 IU/L (ref 44–121)
BUN/Creatinine Ratio: 20 (ref 9–23)
BUN: 15 mg/dL (ref 6–24)
Bilirubin Total: 0.4 mg/dL (ref 0.0–1.2)
CO2: 23 mmol/L (ref 20–29)
Calcium: 9.3 mg/dL (ref 8.7–10.2)
Chloride: 102 mmol/L (ref 96–106)
Creatinine, Ser: 0.75 mg/dL (ref 0.57–1.00)
Globulin, Total: 2.5 g/dL (ref 1.5–4.5)
Glucose: 77 mg/dL (ref 70–99)
Potassium: 4.1 mmol/L (ref 3.5–5.2)
Sodium: 137 mmol/L (ref 134–144)
Total Protein: 7.1 g/dL (ref 6.0–8.5)
eGFR: 103 mL/min/{1.73_m2} (ref >59)

## 2021-10-02 LAB — CBC WITH DIFFERENTIAL/PLATELET
Basophils Absolute: 0 10*3/uL (ref 0.0–0.2)
Basos: 0 %
EOS (ABSOLUTE): 0.2 10*3/uL (ref 0.0–0.4)
Eos: 2 %
Hematocrit: 39.9 % (ref 34.0–46.6)
Hemoglobin: 12.8 g/dL (ref 11.1–15.9)
Immature Grans (Abs): 0 10*3/uL (ref 0.0–0.1)
Immature Granulocytes: 0 %
Lymphocytes Absolute: 1.8 10*3/uL (ref 0.7–3.1)
Lymphs: 23 %
MCH: 29 pg (ref 26.6–33.0)
MCHC: 32.1 g/dL (ref 31.5–35.7)
MCV: 91 fL (ref 79–97)
Monocytes Absolute: 0.6 10*3/uL (ref 0.1–0.9)
Monocytes: 8 %
Neutrophils Absolute: 5 10*3/uL (ref 1.4–7.0)
Neutrophils: 67 %
Platelets: 263 10*3/uL (ref 150–450)
RBC: 4.41 x10E6/uL (ref 3.77–5.28)
RDW: 13.9 % (ref 11.7–15.4)
WBC: 7.5 10*3/uL (ref 3.4–10.8)

## 2021-10-02 LAB — HEMOGLOBIN A1C
Est. average glucose Bld gHb Est-mCnc: 111 mg/dL
Hgb A1c MFr Bld: 5.5 % (ref 4.8–5.6)

## 2021-10-02 LAB — LIPID PANEL
Chol/HDL Ratio: 2.7 ratio (ref 0.0–4.4)
Cholesterol, Total: 155 mg/dL (ref 100–199)
HDL: 58 mg/dL (ref >39)
LDL Chol Calc (NIH): 81 mg/dL (ref 0–99)
Triglycerides: 83 mg/dL (ref 0–149)
VLDL Cholesterol Cal: 16 mg/dL (ref 5–40)

## 2021-10-02 LAB — T4, FREE: Free T4: 0.93 ng/dL (ref 0.82–1.77)

## 2021-10-02 LAB — FOLATE: Folate: 2.3 ng/mL — ABNORMAL LOW (ref >3.0)

## 2021-10-02 LAB — VITAMIN D 25 HYDROXY (VIT D DEFICIENCY, FRACTURES): Vit D, 25-Hydroxy: 18.9 ng/mL — ABNORMAL LOW (ref 30.0–100.0)

## 2021-10-02 LAB — VITAMIN B12: Vitamin B-12: 384 pg/mL (ref 232–1245)

## 2021-10-02 LAB — TSH: TSH: 2.42 u[IU]/mL (ref 0.450–4.500)

## 2021-10-02 LAB — INSULIN, RANDOM: INSULIN: 6.5 u[IU]/mL (ref 2.6–24.9)

## 2021-10-02 LAB — T3, FREE: T3, Free: 2.8 pg/mL (ref 2.0–4.4)
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
## 2021-10-06 NOTE — Progress Notes (Signed)
? ? ? ?Chief Complaint:  ? ?OBESITY ?Diane Matthews (MR# [id number]) is a 41 y.o. female who presents for evaluation and treatment of obesity and related comorbidities. Current BMI is Body mass index is 30.51 kg/m?Marland Kitchen Diane Matthews has been struggling with her weight for many years and has been unsuccessful in either losing weight, maintaining weight loss, or reaching her healthy weight goal. ? ?Diane Matthews is currently in the action stage of change and ready to dedicate time achieving and maintaining a healthier weight. Diane Matthews is interested in becoming our patient and working on intensive lifestyle modifications including (but not limited to) diet and exercise for weight loss. ? ?Diane Matthews habits were reviewed today and are as follows: Her family eats meals together, she thinks her family will eat healthier with her, she struggles with family and or coworkers weight loss sabotage, her desired weight loss is 39 lbs, she started gaining weight after pregnancy, her heaviest weight ever was 162 pounds, she has significant food cravings issues, she is frequently drinking liquids with calories, she has problems with excessive hunger, she frequently eats larger portions than normal, and she struggles with emotional eating. ? ?Depression Screen ?Diane Matthews's Food and Mood (modified PHQ-9) score was 20. ? ?Depression screen Diane Matthews 2/9 10/01/2021  ?Decreased Interest 3  ?Down, Depressed, Hopeless 3  ?PHQ - 2 Score 6  ?Altered sleeping 3  ?Tired, decreased energy 3  ?Change in appetite 3  ?Feeling bad or failure about yourself  3  ?Trouble concentrating 2  ?Moving slowly or fidgety/restless 0  ?Suicidal thoughts 0  ?PHQ-9 Score 20  ?Difficult doing work/chores Somewhat difficult  ? ?Subjective:  ? ?1. Other fatigue ?Diane Matthews admits to daytime somnolence and admits to waking up still tired. Patient has a history of symptoms of daytime fatigue, morning fatigue, and morning headache. Diane Matthews generally gets 7 or 8 hours of sleep per night, and  states that she has nightime awakenings. Snoring is present. Apneic episodes are not present. Epworth Sleepiness Score is 19.  ? ?2. SOB (shortness of breath) on exertion ?Diane Matthews notes increasing shortness of breath with exercising and seems to be worsening over time with weight gain. She notes getting out of breath sooner with activity than she used to. This has not gotten worse recently. Diane Matthews denies shortness of breath at rest or orthopnea. ? ?3. Vitamin D deficiency ?Diane Matthews has a history of Vit D deficiency, and she notes fatigue. She has no recent labs. ? ?4. B12 deficiency ?Diane Matthews has a history of B12 deficiency with a history of anemia. She notes fatigue, and she has no recent labs. ? ?5. Anxiety ?Diane Matthews has a history of anxiety on Lexapro. She has significant emotional eating behaviors, and she feels judge about her weight. She notes guilt issues associated with eating at times, and she has an "all or nothing" personality. She grew up in [place].  ? ?6. At risk for heart disease ?Diane Matthews is at higher than average risk for cardiovascular disease due to obesity. ? ?Assessment/Plan:  ? ?1. Other fatigue ?Diane Matthews does feel that her weight is causing her energy to be lower than it should be. Fatigue may be related to obesity, depression or many other causes. Labs will be ordered, and in the meanwhile, Diane Matthews will focus on self care including making healthy food choices, increasing physical activity and focusing on stress reduction. ? ?- CBC with Differential/Platelet ?- Comprehensive metabolic panel ?- EKG [id number] ?- Folate ?- Hemoglobin A1c ?- Insulin, random ?- Lipid panel ?- T3,  free ?- T4, free ?- TSH ? ?2. SOB (shortness of breath) on exertion ?Diane Matthews does feel that she gets out of breath more easily that she used to when she exercises. Diane Matthews's shortness of breath appears to be obesity related and exercise induced. She has agreed to work on weight loss and gradually increase exercise to treat her exercise  induced shortness of breath. Will continue to monitor closely. ? ?3. Vitamin D deficiency ?We will check labs today, and we will follow up at Diane Matthews next visit. ? ?- VITAMIN D 25 Hydroxy (Vit-D Deficiency, Fractures) ? ?4. B12 deficiency ?The diagnosis was reviewed with the patient. We will check labs today, and we will follow up at Diane Matthews next visit. Orders and follow up as documented in patient record. ? ?- Vitamin B12 ? ?5. Anxiety ?Behavior modification techniques were discussed today to help Diane Matthews deal with her anxiety. We will refer to Dr. Mallie Matthews, our Bariatric Psychologist for evaluation and treatment. Orders and follow up as documented in patient record.  ? ?6. Screening for depression ?Diane Matthews had a positive depression screening. Depression is commonly associated with obesity and often results in emotional eating behaviors. We will monitor this closely and work on CBT to help improve the non-hunger eating patterns. Referral to Psychology may be required if no improvement is seen as she continues in our Matthews. ? ?7. At risk for heart disease ?Diane Matthews was given approximately 30 minutes of coronary artery disease prevention counseling today. She is 41 y.o. female and has risk factors for heart disease including obesity. We discussed intensive lifestyle modifications today with an emphasis on specific weight loss instructions and strategies. ? ?Repetitive spaced learning was employed today to elicit superior memory formation and behavioral change.  ? ?8. Obesity (BMI 30-39.9), current BMI 30.5 ?Diane Matthews is currently in the action stage of change and her goal is to continue with weight loss efforts. I recommend Diane Matthews begin the structured treatment plan as follows: ? ?She has agreed to the Category 2 Plan. ? ?Exercise goals: No exercise has been prescribed for now, while we concentrate on nutritional changes. ? ?Behavioral modification strategies: increasing lean protein intake and no skipping meals. ? ?She  was informed of the importance of frequent follow-up visits to maximize her success with intensive lifestyle modifications for her multiple health conditions. She was informed we would discuss her lab results at her next visit unless there is a critical issue that needs to be addressed sooner. Brinkley agreed to keep her next visit at the agreed upon time to discuss these results. ? ?Objective:  ? ?Blood pressure 97/64, pulse 70, temperature 98.1 ?F (36.7 ?C), temperature source Oral, height 5\' 6"  (1.676 m), weight 189 lb (85.7 kg), last menstrual period 09/13/2021, SpO2 98 %. Body mass index is 30.51 kg/m?. ? ?EKG: Normal sinus rhythm, rate 79 BPM. ? ?Indirect Calorimeter completed today shows a VO2 of 252 and a REE of 1742.  Her calculated basal metabolic rate is [id number] thus her basal metabolic rate is better than expected. ? ?General: Cooperative, alert, well developed, in no acute distress. ?HEENT: Conjunctivae and lids unremarkable. ?Cardiovascular: Regular rhythm.  ?Lungs: Normal work of breathing. ?Neurologic: No focal deficits.  ? ?Lab Results  ?Component Value Date  ? CREATININE 0.75 10/01/2021  ? BUN 15 10/01/2021  ? NA 137 10/01/2021  ? K 4.1 10/01/2021  ? CL 102 10/01/2021  ? CO2 23 10/01/2021  ? ?Lab Results  ?Component Value Date  ? ALT 11 10/01/2021  ? AST  10 10/01/2021  ? ALKPHOS 73 10/01/2021  ? BILITOT 0.4 10/01/2021  ? ?Lab Results  ?Component Value Date  ? HGBA1C 5.5 10/01/2021  ? ?Lab Results  ?Component Value Date  ? INSULIN 6.5 10/01/2021  ? ?Lab Results  ?Component Value Date  ? TSH 2.420 10/01/2021  ? ?Lab Results  ?Component Value Date  ? CHOL 155 10/01/2021  ? HDL 58 10/01/2021  ? [place] 81 10/01/2021  ? TRIG 83 10/01/2021  ? CHOLHDL 2.7 10/01/2021  ? ?Lab Results  ?Component Value Date  ? WBC 7.5 10/01/2021  ? HGB 12.8 10/01/2021  ? HCT 39.9 10/01/2021  ? MCV 91 10/01/2021  ? PLT 263 10/01/2021  ? ?No results found for: IRON, TIBC, FERRITIN ? ?Attestation Statements:  ? ?Reviewed by  clinician on day of visit: allergies, medications, problem list, medical history, surgical history, family history, social history, and previous encounter notes. ? ? ?I, Trixie Dredge, am acting as transcriptionist

## 2021-10-07 ENCOUNTER — Telehealth (INDEPENDENT_AMBULATORY_CARE_PROVIDER_SITE_OTHER): Payer: BC Managed Care – PPO | Admitting: Psychology

## 2021-10-07 DIAGNOSIS — F4323 Adjustment disorder with mixed anxiety and depressed mood: Secondary | ICD-10-CM

## 2021-10-07 DIAGNOSIS — F5089 Other specified eating disorder: Secondary | ICD-10-CM

## 2021-10-07 NOTE — Progress Notes (Unsigned)
?  Office: [phone]  /  Fax: [phone] ? ? ? ?Date: 10/21/2021   ?Appointment Start Time: *** ?Duration: *** minutes ?Provider: Lawerance Cruel, Psy.D. ?Type of Session: Individual Therapy  ?Location of Patient: {gbptloc:23249} (private location) ?Location of Provider: Provider's Home (private office) ?Type of Contact: Telepsychological Visit via MyChart Video Visit ? ?Session Content: Diane Matthews is a 41 y.o. female presenting for a follow-up appointment to address the previously established treatment goal of increasing coping skills.Today's appointment was a telepsychological visit due to COVID-19. Diane Matthews provided verbal consent for today's telepsychological appointment and she is aware she is responsible for securing confidentiality on her end of the session. Prior to proceeding with today's appointment, Diane Matthews's physical location at the time of this appointment was obtained as well a phone number she could be reached at in the event of technical difficulties. Diane Matthews and this provider participated in today's telepsychological service.  ? ?This provider conducted a brief check-in. *** [name] was receptive to today's appointment as evidenced by openness to sharing, responsiveness to feedback, and {gbreceptiveness:23401}. ? ?Mental Status Examination:  ?Appearance: {Appearance:22431} ?Behavior: {Behavior:22445} ?Mood: {gbmood:21757} ?Affect: {Affect:22436} ?Speech: {Speech:22432} ?Eye Contact: {Eye Contact:22433} ?Psychomotor Activity: {Motor Activity:22434} ?Gait: {gbgait:23404} ?Thought Process: {thought process:22448}  ?Thought Content/Perception: {disturbances:22451} ?Orientation: {Orientation:22437} ?Memory/Concentration: {gbcognition:22449} ?Insight: {Insight:22446} ?Judgment: {Insight:22446} ? ?Interventions:  ?{Interventions for Progress Notes:23405} ? ?DSM-5 Diagnosis(es):  F50.89 Other Specified Feeding or Eating Disorder, Emotional Eating Behaviors and F43.23 Adjustment Disorder, With Mixed Anxiety and  Depressed Mood ? ?Treatment Goal & Progress: During the initial appointment with this provider, the following treatment goal was established: increase coping skills. [name] has demonstrated progress in her goal as evidenced by {gbtxprogress:22839}. [name] also {gbtxprogress2:22951}. ? ?Plan: The next appointment will be scheduled in {gbweeks:21758}, which will be via MyChart Video Visit. The next session will focus on {Plan for Next Appointment:23400}. ? ?

## 2021-10-15 ENCOUNTER — Other Ambulatory Visit: Payer: Self-pay

## 2021-10-15 ENCOUNTER — Encounter (INDEPENDENT_AMBULATORY_CARE_PROVIDER_SITE_OTHER): Payer: Self-pay | Admitting: Family Medicine

## 2021-10-15 ENCOUNTER — Ambulatory Visit (INDEPENDENT_AMBULATORY_CARE_PROVIDER_SITE_OTHER): Payer: BC Managed Care – PPO | Admitting: Family Medicine

## 2021-10-15 VITALS — BP 96/64 | HR 77 | Temp 98.3°F | Ht 66.0 in | Wt 189.0 lb

## 2021-10-15 DIAGNOSIS — E559 Vitamin D deficiency, unspecified: Secondary | ICD-10-CM | POA: Diagnosis not present

## 2021-10-15 DIAGNOSIS — E669 Obesity, unspecified: Secondary | ICD-10-CM | POA: Diagnosis not present

## 2021-10-15 DIAGNOSIS — Z9189 Other specified personal risk factors, not elsewhere classified: Secondary | ICD-10-CM

## 2021-10-15 DIAGNOSIS — E538 Deficiency of other specified B group vitamins: Secondary | ICD-10-CM | POA: Diagnosis not present

## 2021-10-15 DIAGNOSIS — Z6830 Body mass index (BMI) 30.0-30.9, adult: Secondary | ICD-10-CM | POA: Diagnosis not present

## 2021-10-15 MED ORDER — VITAMIN D (ERGOCALCIFEROL) 1.25 MG (50000 UNIT) PO CAPS
50000.0000 [IU] | ORAL_CAPSULE | ORAL | 0 refills | Status: DC
Start: 2021-10-15 — End: 2021-11-05

## 2021-10-15 MED ORDER — PRENATAL VITAMINS 28-0.8 MG PO TABS: 1.0000 | ORAL_TABLET | Freq: Every day | ORAL | 0 refills | Status: DC

## 2021-10-20 DIAGNOSIS — J00 Acute nasopharyngitis [common cold]: Secondary | ICD-10-CM | POA: Diagnosis not present

## 2021-10-20 DIAGNOSIS — H109 Unspecified conjunctivitis: Secondary | ICD-10-CM | POA: Diagnosis not present

## 2021-10-20 NOTE — Progress Notes (Signed)
? ? ? ?Chief Complaint:  ? ?Diane Matthews ?Eretria is here to discuss her progress with her Diane Matthews treatment plan along with follow-up of her Diane Matthews related diagnoses. Raiden is on the Category 2 Plan and states she is following her eating plan approximately 50% of the time. Talona states she is doing 0 minutes 0 times per week. ? ?Today's visit was #: 2 ?Starting weight: 189 lbs ?Starting date: 10/01/2021 ?Today's weight: 189 lbs ?Today's date: 10/15/2021 ?Total lbs lost to date: 0 ?Total lbs lost since last in-office visit: 0 ? ?Interim History: Charish struggled to follow her plan closely as she had company for much of the last 2 weeks. She did well with breakfast and tried to portion control and was able to avoid gaining weight.  ? ?Subjective:  ? ?1. Vitamin D deficiency ?Tamieka's Vitamin D level is very low. She notes fatigue and she is not on Vit D. I discussed labs with the patient today.  ? ?2. B12 deficiency ?Poppy's B12 is below goal. She has no anemia or GI surgery. I discussed labs with the patient today.  ? ?3. Folate deficiency ?Will's folic acid is low which is surprising as she eats a fair amount of green leafy vegetables. She notes fatigue. I discussed labs with the patient today.  ? ?4. At risk for malnutrition ?Anyce is at increased risk for malnutrition due to multiple Vit deficiencies. I discussed labs with the patient today.  ? ?Assessment/Plan:  ? ?1. Vitamin D deficiency ?Hollyn agreed to start prescription Vitamin D 50,000 IU every week with no refills. We will recheck labs in 3 months, and she will follow-up for routine testing of Vitamin D, at least 2-3 times per year to avoid over-replacement. ? ?- Vitamin D, Ergocalciferol, (DRISDOL) 1.25 MG (50000 UNIT) CAPS capsule; Take 1 capsule (50,000 Units total) by mouth every 7 (seven) days.  Dispense: 4 capsule; Refill: 0 ? ?2. B12 deficiency ?Aniyiah agreed to start prenatal Vitamins and will continue her B12 rich diet. We will recheck labs in 3  months.  ? ?3. Folate deficiency ?Tassy agreed to start prenatal Vitamins 28-0.8 mg with no refills. We will recheck labs in 3 months.  ? ?- Prenatal Vit-Fe Fumarate-FA (PRENATAL VITAMINS) 28-0.8 MG TABS; Take 1 tablet by mouth daily.  Dispense: 30 tablet; Refill: 0 ? ?4. At risk for malnutrition ?Ishanvi was given approximately 30 minutes of counseling today regarding prevention of malnutrition and ways to meet macronutrient goals..   ? ?5. Diane Matthews (BMI 30-39.9), current BMI 30.5  ?Savanaha is currently in the action stage of change. As such, her goal is to continue with weight loss efforts. She has agreed to the Category 2 Plan.  ? ?Behavioral modification strategies: meal planning and cooking strategies. ? ?Saraiah has agreed to follow-up with our clinic in 2 to 3 weeks. She was informed of the importance of frequent follow-up visits to maximize her success with intensive lifestyle modifications for her multiple health conditions.  ? ?Objective:  ? ?Blood pressure 96/64, pulse 77, temperature 98.3 ?F (36.8 ?C), height 5\' 6"  (1.676 m), weight 189 lb (85.7 kg), SpO2 97 %. ?Body mass index is 30.51 kg/m?. ? ?General: Cooperative, alert, well developed, in no acute distress. ?HEENT: Conjunctivae and lids unremarkable. ?Cardiovascular: Regular rhythm.  ?Lungs: Normal work of breathing. ?Neurologic: No focal deficits.  ? ?Lab Results  ?Component Value Date  ? CREATININE 0.75 10/01/2021  ? BUN 15 10/01/2021  ? NA 137 10/01/2021  ? K 4.1 10/01/2021  ?  CL 102 10/01/2021  ? CO2 23 10/01/2021  ? ?Lab Results  ?Component Value Date  ? ALT 11 10/01/2021  ? AST 10 10/01/2021  ? ALKPHOS 73 10/01/2021  ? BILITOT 0.4 10/01/2021  ? ?Lab Results  ?Component Value Date  ? HGBA1C 5.5 10/01/2021  ? ?Lab Results  ?Component Value Date  ? INSULIN 6.5 10/01/2021  ? ?Lab Results  ?Component Value Date  ? TSH 2.420 10/01/2021  ? ?Lab Results  ?Component Value Date  ? CHOL 155 10/01/2021  ? HDL 58 10/01/2021  ? [place] 81 10/01/2021  ? TRIG 83  10/01/2021  ? CHOLHDL 2.7 10/01/2021  ? ?Lab Results  ?Component Value Date  ? VD25OH 18.9 (L) 10/01/2021  ? VD25OH 27.7 (L) 01/29/2020  ? ?Lab Results  ?Component Value Date  ? WBC 7.5 10/01/2021  ? HGB 12.8 10/01/2021  ? HCT 39.9 10/01/2021  ? MCV 91 10/01/2021  ? PLT 263 10/01/2021  ? ?No results found for: IRON, TIBC, FERRITIN ? ?Attestation Statements:  ? ?Reviewed by clinician on day of visit: allergies, medications, problem list, medical history, surgical history, family history, social history, and previous encounter notes. ? ? ?I, Trixie Dredge, am acting as transcriptionist for Dennard Nip, MD. ? ?I have reviewed the above documentation for accuracy and completeness, and I agree with the above. -  Dennard Nip, MD ? ? ?

## 2021-10-21 ENCOUNTER — Telehealth (INDEPENDENT_AMBULATORY_CARE_PROVIDER_SITE_OTHER): Payer: BC Managed Care – PPO | Admitting: Psychology

## 2021-10-21 NOTE — Progress Notes (Signed)
?  Office: [phone]  /  Fax: [phone] ? ? ? ?Date: 10/28/2021   ?Appointment Start Time: 11:02am ?Duration: 25 minutes ?Provider: Lawerance Cruel, Psy.D. ?Type of Session: Individual Therapy  ?Location of Patient: Home (private location) ?Location of Provider: Provider's Home (private office) ?Type of Contact: Telepsychological Visit via MyChart Video Visit ? ?Session Content: Diane Matthews is a 41 y.o. female presenting for a follow-up appointment to address the previously established treatment goal of increasing coping skills.Today's appointment was a telepsychological visit due to COVID-19. Diane Matthews provided verbal consent for today's telepsychological appointment and she is aware she is responsible for securing confidentiality on her end of the session. Prior to proceeding with today's appointment, Diane Matthews's physical location at the time of this appointment was obtained as well a phone number she could be reached at in the event of technical difficulties. Diane Matthews and this provider participated in today's telepsychological service.  ? ?This provider conducted a brief check-in. Diane Matthews shared about recent events. She explained she did not get a chance to schedule an appointment with a therapist for traditional therapy, adding, "I'm feeling better." Further explored and processed. This provider continued to recommended therapeutic services. She was receptive. Additionally, Diane Matthews stated she is trying her best to follow the structured meal plan, adding she continues to crave sweets. Reviewed emotional and physical hunger. Psychoeducation regarding triggers for emotional eating was provided. Diane Matthews was provided a handout, and encouraged to utilize the handout between now and the next appointment to increase awareness of triggers and frequency. Diane Matthews agreed. This provider also discussed behavioral strategies for specific triggers, such as placing the utensil down when conversing to avoid mindless eating. Diane Matthews provided  verbal consent during today's appointment for this provider to send a handout about triggers via e-mail. Diane Matthews was receptive to today's appointment as evidenced by openness to sharing, responsiveness to feedback, and willingness to explore triggers for emotional eating. ? ?Mental Status Examination:  ?Appearance: neat ?Behavior: appropriate to circumstances ?Mood: neutral ?Affect: mood congruent ?Speech: WNL ?Eye Contact: appropriate ?Psychomotor Activity: WNL ?Gait: unable to assess ?Thought Process: linear, logical, and goal directed and no evidence or endorsement of suicidal, homicidal, and self-harm ideation, plan and intent  ?Thought Content/Perception: no hallucinations, delusions, bizarre thinking or behavior endorsed or observed ?Orientation: AAOx4 ?Memory/Concentration: memory, attention, language, and fund of knowledge intact  ?Insight: fair ?Judgment: fair ? ?Interventions:  ?Conducted a brief chart review ?Provided empathic reflections and validation ?Reviewed content from the previous session ?Employed supportive psychotherapy interventions to facilitate reduced distress and to improve coping skills with identified stressors ?Recommended/discussed option for longer-term therapeutic services ?Psychoeducation provided regarding triggers for emotional eating behaviors ? ?DSM-5 Diagnosis(es):  F50.89 Other Specified Feeding or Eating Disorder, Emotional Eating Behaviors and F43.23 Adjustment Disorder, With Mixed Anxiety and Depressed Mood ? ?Treatment Goal & Progress: During the initial appointment with this provider, the following treatment goal was established: increase coping skills. Progress is limited, as Diane Matthews has just begun treatment with this provider; however, she is receptive to the interaction and interventions and rapport is being established.  ? ?Plan: Due to [name]'s upcoming travel, the next appointment will be scheduled in three weeks, which will be via MyChart Video Visit. The next  session will focus on working towards the established treatment goal. Additionally, Diane Matthews will establish care with a primary therapist.  ? ?

## 2021-10-28 ENCOUNTER — Telehealth (INDEPENDENT_AMBULATORY_CARE_PROVIDER_SITE_OTHER): Payer: BC Managed Care – PPO | Admitting: Psychology

## 2021-10-28 DIAGNOSIS — F4323 Adjustment disorder with mixed anxiety and depressed mood: Secondary | ICD-10-CM

## 2021-10-28 DIAGNOSIS — F5089 Other specified eating disorder: Secondary | ICD-10-CM | POA: Diagnosis not present

## 2021-11-04 NOTE — Progress Notes (Unsigned)
?  Office: [phone]  /  Fax: [phone] ? ? ? ?Date: 11/18/2021   ?Appointment Start Time: *** ?Duration: *** minutes ?Provider: Lawerance Cruel, Psy.D. ?Type of Session: Individual Therapy  ?Location of Patient: {gbptloc:23249} (private location) ?Location of Provider: Provider's Home (private office) ?Type of Contact: Telepsychological Visit via MyChart Video Visit ? ?Session Content: Diane Matthews is a 41 y.o. female presenting for a follow-up appointment to address the previously established treatment goal of increasing coping skills.Today's appointment was a telepsychological visit due to COVID-19. Julie provided verbal consent for today's telepsychological appointment and she is aware she is responsible for securing confidentiality on her end of the session. Prior to proceeding with today's appointment, Kati's physical location at the time of this appointment was obtained as well a phone number she could be reached at in the event of technical difficulties. [name] and this provider participated in today's telepsychological service.  ? ?This provider conducted a brief check-in. *** Sharia was receptive to today's appointment as evidenced by openness to sharing, responsiveness to feedback, and {gbreceptiveness:23401}. ? ?Mental Status Examination:  ?Appearance: {Appearance:22431} ?Behavior: {Behavior:22445} ?Mood: {gbmood:21757} ?Affect: {Affect:22436} ?Speech: {Speech:22432} ?Eye Contact: {Eye Contact:22433} ?Psychomotor Activity: {Motor Activity:22434} ?Gait: {gbgait:23404} ?Thought Process: {thought process:22448}  ?Thought Content/Perception: {disturbances:22451} ?Orientation: {Orientation:22437} ?Memory/Concentration: {gbcognition:22449} ?Insight: {Insight:22446} ?Judgment: {Insight:22446} ? ?Interventions:  ?{Interventions for Progress Notes:23405} ? ?DSM-5 Diagnosis(es):  F50.89 Other Specified Feeding or Eating Disorder, Emotional Eating Behaviors and F43.23 Adjustment Disorder, With Mixed Anxiety and  Depressed Mood ? ?Treatment Goal & Progress: During the initial appointment with this provider, the following treatment goal was established: increase coping skills. Tiah has demonstrated progress in her goal as evidenced by {gbtxprogress:22839}. Hayat also {gbtxprogress2:22951}. ? ?Plan: The next appointment is scheduled for *** at ***, which will be via MyChart Video Visit. The next session will focus on {Plan for Next Appointment:23400}. ? ?

## 2021-11-05 ENCOUNTER — Ambulatory Visit (INDEPENDENT_AMBULATORY_CARE_PROVIDER_SITE_OTHER): Payer: BC Managed Care – PPO | Admitting: Family Medicine

## 2021-11-05 ENCOUNTER — Encounter (INDEPENDENT_AMBULATORY_CARE_PROVIDER_SITE_OTHER): Payer: Self-pay | Admitting: Family Medicine

## 2021-11-05 VITALS — BP 111/67 | HR 77 | Temp 98.4°F | Ht 66.0 in | Wt 185.0 lb

## 2021-11-05 DIAGNOSIS — E669 Obesity, unspecified: Secondary | ICD-10-CM | POA: Diagnosis not present

## 2021-11-05 DIAGNOSIS — Z6830 Body mass index (BMI) 30.0-30.9, adult: Secondary | ICD-10-CM

## 2021-11-05 DIAGNOSIS — E8881 Metabolic syndrome: Secondary | ICD-10-CM

## 2021-11-05 DIAGNOSIS — E559 Vitamin D deficiency, unspecified: Secondary | ICD-10-CM | POA: Diagnosis not present

## 2021-11-05 MED ORDER — VITAMIN D (ERGOCALCIFEROL) 1.25 MG (50000 UNIT) PO CAPS: 50000.0000 [IU] | ORAL_CAPSULE | ORAL | 0 refills | Status: DC

## 2021-11-13 ENCOUNTER — Other Ambulatory Visit (INDEPENDENT_AMBULATORY_CARE_PROVIDER_SITE_OTHER): Payer: Self-pay | Admitting: Family Medicine

## 2021-11-13 DIAGNOSIS — E538 Deficiency of other specified B group vitamins: Secondary | ICD-10-CM

## 2021-11-16 NOTE — Progress Notes (Signed)
Chief Complaint:   OBESITY Diane Matthews is here to discuss her progress with her obesity treatment plan along with follow-up of her obesity related diagnoses. [name] is on the Category 2 Plan and states she is following her eating plan approximately 50% of the time. [name] states she is not exercising.  Today's visit was #: 3 Starting weight: 189 lbs Starting date: 10/01/2021 Today's weight: 185 lbs Today's date: 11/05/2021 Total lbs lost to date: 4 Total lbs lost since last in-office visit: 4  Interim History: Diane Matthews made a list at the last appointment of all the food she needs to get in for the day. For breakfast, she spreads out food for the day. She has noticed significant soda and chocolate cravings. Leaner has been doing microwave meals for lunch and smaller portions at dinner. She is doing sandwich, pasta, soup, or chicken filet. Landrie is traveling to [facility] tomorrow and traveling for the next 3 weeks.  Subjective:   1. Vitamin D deficiency Dorlisa's last vitamin D level was 18.9 and she is on prescription vitamin D. Ezma notes fatigue and denies nausea, vomiting, and muscle weakness.  2. Insulin resistance Diane Matthews's A1c was 5.5 and Insulin was 6.5. She still has some drive for chocolate and has stopped soda.  Assessment/Plan:   1. Vitamin D deficiency Diane Matthews agrees to continue taking prescription vitamin D 50,000IU weekly and will follow up at the agreed upon time.    - Vitamin D, Ergocalciferol, (DRISDOL) 1.25 MG (50000 UNIT) CAPS capsule; Take 1 capsule (50,000 Units total) by mouth every 7 (seven) days.  Dispense: 4 capsule; Refill: 0  2. Insulin resistance I discussed label reading and being more mindful of increasing protein and more in control of carbs.  3. Obesity with current BMI of 30.0 Diane Matthews is currently in the action stage of change. As such, her goal is to continue with weight loss efforts. She has agreed to keeping a food journal and adhering to recommended  goals of 1250 to 1400 calories and 85+ grams of protein daily.  Exercise goals: No exercise has been prescribed at this time. All adults should avoid inactivity. Some physical activity is better than none, and adults who participate in any amount of physical activity gain some health benefits.  Behavioral modification strategies: increasing lean protein intake, meal planning and cooking strategies, keeping healthy foods in the home, and planning for success.  Diane Matthews has agreed to follow-up with our clinic in 4 weeks. She was informed of the importance of frequent follow-up visits to maximize her success with intensive lifestyle modifications for her multiple health conditions.   Objective:   Blood pressure 111/67, pulse 77, temperature 98.4 F (36.9 C), height 5\' 6"  (1.676 m), weight 185 lb (83.9 kg), last menstrual period 10/31/2021, SpO2 97 %. Body mass index is 29.86 kg/m.  General: Cooperative, alert, well developed, in no acute distress. HEENT: Conjunctivae and lids unremarkable. Cardiovascular: Regular rhythm.  Lungs: Normal work of breathing. Neurologic: No focal deficits.   Lab Results  Component Value Date   CREATININE 0.75 10/01/2021   BUN 15 10/01/2021   NA 137 10/01/2021   K 4.1 10/01/2021   CL 102 10/01/2021   CO2 23 10/01/2021   Lab Results  Component Value Date   ALT 11 10/01/2021   AST 10 10/01/2021   ALKPHOS 73 10/01/2021   BILITOT 0.4 10/01/2021   Lab Results  Component Value Date   HGBA1C 5.5 10/01/2021   Lab Results  Component Value Date  INSULIN 6.5 10/01/2021   Lab Results  Component Value Date   TSH 2.420 10/01/2021   Lab Results  Component Value Date   CHOL 155 10/01/2021   HDL 58 10/01/2021   LDLCALC 81 10/01/2021   TRIG 83 10/01/2021   CHOLHDL 2.7 10/01/2021   Lab Results  Component Value Date   VD25OH 18.9 (L) 10/01/2021   VD25OH 27.7 (L) 01/29/2020   Lab Results  Component Value Date   WBC 7.5 10/01/2021   HGB 12.8  10/01/2021   HCT 39.9 10/01/2021   MCV 91 10/01/2021   PLT 263 10/01/2021   No results found for: IRON, TIBC, FERRITIN  Attestation Statements:   Reviewed by clinician on day of visit: allergies, medications, problem list, medical history, surgical history, family history, social history, and previous encounter notes.  IKirke Corin, CMA, am acting as transcriptionist for Reuben Likes, MD I have reviewed the above documentation for accuracy and completeness, and I agree with the above. - Reuben Likes, MD

## 2021-11-18 ENCOUNTER — Telehealth (INDEPENDENT_AMBULATORY_CARE_PROVIDER_SITE_OTHER): Payer: BC Managed Care – PPO | Admitting: Psychology

## 2021-11-25 NOTE — Progress Notes (Signed)
41 y.o. G38P1001 Married [place] female here for annual exam. Pt reports some B/L breast pains, has reported wt lifting recently. Denies any lumps/bumps.  ? ?She will return to [place] for breast implant removal in November.  ? ?Some months has heavy cycles, other months not so much. ? ?Taking Lexapro with PCP.  ? ?PCP: Mcarthur Rossetti @ [facility]   ? ?Patient's last menstrual period was 11/27/2021 (exact date).     ?Period Cycle (Days): 28 ?Period Duration (Days): 4-5 ?Menstrual Flow:  (varies) ?Menstrual Control: Maxi pad ?Dysmenorrhea: (!) Mild ?Dysmenorrhea Symptoms: Cramping ?    ?Sexually active: Yes.    ?The current method of family planning is IUD/Paragard 05/2019.    ?Exercising: Yes.     Rowing/stair master 3x a week.  ?Smoker: no ? ?Health Maintenance: ?Pap: 07/2017-WNL per pt ?History of abnormal Pap:  no ?MMG: 07-12-19 Diag.Bil.w/US--Stable 0.8 cm mass within the INNER RIGHT breast, compatible with a benign mass/fibroadenoma. ?No mammographic evidence of breast malignancy./screening age 53/BiRads2 ?Colonoscopy: n/a ?BMD: n/a  Result n/a ?TDaP: 07/12/18 ?Gardasil:   no ?HIV: 09/23/2018-NR ?Hep C: no ?Screening Labs:  PCP. ? ? reports that she has never smoked. She has never used smokeless tobacco. She reports that she does not currently use alcohol. She reports that she does not use drugs. ? ?Past Medical History:  ?Diagnosis Date  ? AMA (advanced maternal age) primigravida 35+, unspecified trimester   ? Anemia   ? Anxiety   ? B12 deficiency   ? Back pain   ? Bilateral ovarian cysts   ? Chronic constipation   ? Constipation   ? Depression   ? Edema of both lower extremities   ? Endometriosis   ? Fatigue   ? Hx of breast implants, bilateral 2007  ? In [place], Silicone implants  ? Palpitations   ? SOB (shortness of breath) on exertion   ? Vitamin D deficiency   ? ? ?Past Surgical History:  ?Procedure Laterality Date  ? AUGMENTATION MAMMAPLASTY    ? Silicone implants--[place]  ? CESAREAN SECTION N/A  04/11/2019  ? Procedure: CESAREAN SECTION;  Surgeon: Shea Evans, MD;  Location: [facility] LD ORS;  Service: Obstetrics;  Laterality: N/A;  ? ? ?Current Outpatient Medications  ?Medication Sig Dispense Refill  ? escitalopram (LEXAPRO) 10 MG tablet Take 10 mg by mouth daily.    ? paragard intrauterine copper IUD IUD 1 each by Intrauterine route once.    ? Prenatal Vit-Fe Fumarate-FA (PRENATAL VITAMINS) 28-0.8 MG TABS Take 1 tablet by mouth daily. 30 tablet 0  ? Vitamin D, Ergocalciferol, (DRISDOL) 1.25 MG (50000 UNIT) CAPS capsule Take 1 capsule (50,000 Units total) by mouth every 7 (seven) days. 4 capsule 0  ? ?No current facility-administered medications for this visit.  ? ? ?Family History  ?Problem Relation Age of Onset  ? Thyroid disease Mother   ? Hypertension Mother   ? Bipolar disorder Mother   ? Obesity Mother   ? Diabetes Maternal Grandfather   ? Cancer Paternal Grandmother   ? ? ?Review of Systems  ?All other systems reviewed and are negative. ? ?Exam:   ?BP 110/70   Pulse 82   Ht 5\' 5"  (1.651 m)   Wt 193 lb (87.5 kg)   LMP 11/27/2021 (Exact Date)   SpO2 97%   BMI 32.12 kg/m?     ?General appearance: alert, cooperative and appears stated age ?Head: normocephalic, without obvious abnormality, atraumatic ?Neck: no adenopathy, supple, symmetrical, trachea midline and thyroid normal to  inspection and palpation ?Lungs: clear to auscultation bilaterally ?Breasts: consistent with bilateral augmentation, no masses or tenderness, No nipple retraction or dimpling, No nipple discharge or bleeding, No axillary adenopathy ?Heart: regular rate and rhythm ?Abdomen: soft, non-tender; no masses, no organomegaly ?Extremities: extremities normal, atraumatic, no cyanosis or edema ?Skin: skin color, texture, turgor normal. No rashes or lesions ?Lymph nodes: cervical, supraclavicular, and axillary nodes normal. ?Neurologic: grossly normal ? ?Pelvic: External genitalia:  3 mm raised white lesion of right inferior vulva. ?              No abnormal inguinal nodes palpated. ?             Urethra:  normal appearing urethra with no masses, tenderness or lesions ?             Bartholins and Skenes: normal    ?             Vagina: normal appearing vagina with normal color and discharge, no lesions ?             Cervix: no lesions.  IUD strings noted.  ?             Pap taken: yes ?Bimanual Exam:  Uterus:  normal size, contour, position, consistency, mobility, non-tender ?             Adnexa: no mass, fullness, tenderness ?             Rectal exam: yes.  Confirms. ?             Anus:  normal sphincter tone, no lesions ? ?Chaperone was present for exam:  Ladona Ridgel, CMA ? ?Assessment:   ?Well woman visit with gynecologic exam. ?Paragard IUD.  ?Vulvar lesion. ?Status post bilateral breast augmentation.  ?Hx PP depression and anxiety. ? ?Plan: ?Mammogram screening discussed.  She will schedule screening mammogram.  ?Self breast awareness reviewed. ?Pap and HR HPV collected.   She accepts doing the pap and HR HPV testing today as we do not have complete documentation of the last time it was done.  ?Guidelines for Calcium, Vitamin D, regular exercise program including cardiovascular and weight bearing exercise. ?Start Gardasil series.   ?Return for vulvar biopsy to remove lesion.  ?Follow up annually and prn.  ? ?After visit summary provided.  ? ? ? ?

## 2021-11-27 NOTE — Progress Notes (Signed)
  Office: [phone]  /  Fax: [phone]    Date: 12/08/2021   Appointment Start Time: 2:30pm Duration: 22 minutes Provider: Lawerance Cruel, Psy.D. Type of Session: Individual Therapy  Location of Patient: Home (private location) Location of Provider: Provider's Home (private office) Type of Contact: Telepsychological Visit via MyChart Video Visit  Session Content: Diane Matthews is a 41 y.o. female presenting for a follow-up appointment to address the previously established treatment goal of increasing coping skills.Today's appointment was a telepsychological visit due to COVID-19. Diane Matthews provided verbal consent for today's telepsychological appointment and she is aware she is responsible for securing confidentiality on her end of the session. Prior to proceeding with today's appointment, Diane Matthews's physical location at the time of this appointment was obtained as well a phone number she could be reached at in the event of technical difficulties. Diane Matthews and this provider participated in today's telepsychological service.   Per [facility] new policy, Diane Matthews will be e-mailed two additional forms (AOB and Receipt of NPP) to sign as well as Diane Matthews's Notice of Privacy Practices. The content of the documents were explained to Diane Matthews at the onset of the appointment, and she agreed to proceed. Additionally, she agreed to complete the forms and return them prior to the next appointment with this provider.   This provider conducted a brief check-in. Diane Matthews stated she has been "traveling a lot," which has impacted her routine. Reviewed triggers for emotional eating behaviors. She indicated a reduction in emotional eating behaviors due to increased awareness. Diane Matthews was engaged in problem solving to develop a plan to help cope with urges/cravings involving activities to relax, activities to distract, comforting places, people to call and connect with, and activities that help soothe senses. She was observed writing  the plan. Overall, Diane Matthews was receptive to today's appointment as evidenced by openness to sharing, responsiveness to feedback, and willingness to implement discussed strategies .  Mental Status Examination:  Appearance: neat Behavior: appropriate to circumstances Mood: neutral Affect: mood congruent Speech: WNL Eye Contact: appropriate Psychomotor Activity: WNL Gait: unable to assess Thought Process: linear, logical, and goal directed and no evidence or endorsement of suicidal, homicidal, and self-harm ideation, plan and intent  Thought Content/Perception: no hallucinations, delusions, bizarre thinking or behavior endorsed or observed Orientation: AAOx4 Memory/Concentration: memory, attention, language, and fund of knowledge intact  Insight: good Judgment: good  Interventions:  Conducted a brief chart review Provided empathic reflections and validation Reviewed content from the previous session Employed supportive psychotherapy interventions to facilitate reduced distress and to improve coping skills with identified stressors Engaged patient in problem solving  DSM-5 Diagnosis(es): F50.89 Other Specified Feeding or Eating Disorder, Emotional Eating Behaviors and F43.23 Adjustment Disorder, With Mixed Anxiety and Depressed Mood  Treatment Goal & Progress: During the initial appointment with this provider, the following treatment goal was established: increase coping skills. Diane Matthews has demonstrated progress in her goal as evidenced by increased awareness of hunger patterns and increased awareness of triggers for emotional eating behaviors. Diane Matthews also continues to demonstrate willingness to engage in learned skill(s) and reported a reduction in emotional eating behaviors.   Plan: The next appointment is scheduled for 01/06/2022 at 2:30pm, which will be via MyChart Video Visit. The next session will focus on working towards the established treatment goal. Diane Matthews will complete and return the  forms. Additionally, she will continue to meet with her primary therapist who is in [place] every other week.

## 2021-12-01 ENCOUNTER — Ambulatory Visit (INDEPENDENT_AMBULATORY_CARE_PROVIDER_SITE_OTHER): Payer: BC Managed Care – PPO | Admitting: Obstetrics and Gynecology

## 2021-12-01 ENCOUNTER — Encounter: Payer: Self-pay | Admitting: Obstetrics and Gynecology

## 2021-12-01 ENCOUNTER — Other Ambulatory Visit (HOSPITAL_COMMUNITY)
Admission: RE | Admit: 2021-12-01 | Discharge: 2021-12-01 | Disposition: A | Discharge status: Home / Self Care | Payer: BC Managed Care – PPO | Source: Ambulatory Visit | Attending: Obstetrics and Gynecology | Admitting: Obstetrics and Gynecology

## 2021-12-01 VITALS — BP 110/70 | HR 82 | Ht 65.0 in | Wt 193.0 lb

## 2021-12-01 DIAGNOSIS — Z01419 Encounter for gynecological examination (general) (routine) without abnormal findings: Secondary | ICD-10-CM

## 2021-12-01 DIAGNOSIS — Z124 Encounter for screening for malignant neoplasm of cervix: Secondary | ICD-10-CM

## 2021-12-01 DIAGNOSIS — Z23 Encounter for immunization: Secondary | ICD-10-CM | POA: Diagnosis not present

## 2021-12-01 DIAGNOSIS — N9089 Other specified noninflammatory disorders of vulva and perineum: Secondary | ICD-10-CM | POA: Diagnosis not present

## 2021-12-01 NOTE — Patient Instructions (Addendum)
EXERCISE AND DIET:  We recommended that you start or continue a regular exercise program for good health. Regular exercise means any activity that makes your heart beat faster and makes you sweat.  We recommend exercising at least 30 minutes per day at least 3 days a week, preferably 4 or 5.  We also recommend a diet low in fat and sugar.  Inactivity, poor dietary choices and obesity can cause diabetes, heart attack, stroke, and kidney damage, among others.   ? ?ALCOHOL AND SMOKING:  Women should limit their alcohol intake to no more than 7 drinks/beers/glasses of wine (combined, not each!) per week. Moderation of alcohol intake to this level decreases your risk of breast cancer and liver damage. And of course, no recreational drugs are part of a healthy lifestyle.  And absolutely no smoking or even second hand smoke. Most people know smoking can cause heart and lung diseases, but did you know it also contributes to weakening of your bones? Aging of your skin?  Yellowing of your teeth and nails? ? ?CALCIUM AND VITAMIN D:  Adequate intake of calcium and Vitamin D are recommended.  The recommendations for exact amounts of these supplements seem to change often, but generally speaking 600 mg of calcium (either carbonate or citrate) and 800 units of Vitamin D per day seems prudent. Certain women may benefit from higher intake of Vitamin D.  If you are among these women, your doctor will have told you during your visit.   ? ?PAP SMEARS:  Pap smears, to check for cervical cancer or precancers,  have traditionally been done yearly, although recent scientific advances have shown that most women can have pap smears less often.  However, every woman still should have a physical exam from her gynecologist every year. It will include a breast check, inspection of the vulva and vagina to check for abnormal growths or skin changes, a visual exam of the cervix, and then an exam to evaluate the size and shape of the uterus and  ovaries.  And after 40 years of age, a rectal exam is indicated to check for rectal cancers. We will also provide age appropriate advice regarding health maintenance, like when you should have certain vaccines, screening for sexually transmitted diseases, bone density testing, colonoscopy, mammograms, etc.  ? ?MAMMOGRAMS:  All women over 40 years old should have a yearly mammogram. Many facilities now offer a "3D" mammogram, which may cost around $50 extra out of pocket. If possible,  we recommend you accept the option to have the 3D mammogram performed.  It both reduces the number of women who will be called back for extra views which then turn out to be normal, and it is better than the routine mammogram at detecting truly abnormal areas.   ? ?COLONOSCOPY:  Colonoscopy to screen for colon cancer is recommended for all women at age 50.  We know, you hate the idea of the prep.  We agree, BUT, having colon cancer and not knowing it is worse!!  Colon cancer so often starts as a polyp that can be seen and removed at colonscopy, which can quite literally save your life!  And if your first colonoscopy is normal and you have no family history of colon cancer, most women don't have to have it again for 10 years.  Once every ten years, you can do something that may end up saving your life, right?  We will be happy to help you get it scheduled when you are ready.    Be sure to check your insurance coverage so you understand how much it will cost.  It may be covered as a preventative service at no cost, but you should check your particular policy.   ? ?Calcium Content in Foods ?Calcium is the most abundant mineral in the body. Most of the body's calcium supply is stored in bones and teeth. Calcium helps many parts of the body function normally, including: ?Blood and blood vessels. ?Nerves. ?Hormones. ?Muscles. ?Bones and teeth. ?When your calcium stores are low, you may be at risk for low bone mass, bone loss, and broken bones  (fractures). When you get enough calcium, it helps to support strong bones and teeth throughout your life. ?Calcium is especially important for: ?Children during growth spurts. ?Girls during adolescence. ?Women who are pregnant or breastfeeding. ?Women after their menstrual cycle stops (postmenopause). ?Women whose menstrual cycle has stopped due to anorexia nervosa or regular intense exercise. ?People who cannot eat or digest dairy products. ?Vegans. ?Recommended daily amounts of calcium: ?Women (ages 19 to 50): 1,000 mg per day. ?Women (ages 51 and older): 1,200 mg per day. ?Men (ages 19 to 70): 1,000 mg per day. ?Men (ages 71 and older): 1,200 mg per day. ?Women (ages 9 to 18): 1,300 mg per day. ?Men (ages 9 to 18): 1,300 mg per day. ?General information ?Eat foods that are high in calcium. Try to get most of your calcium from food. ?Some people may benefit from taking calcium supplements. Check with your health care provider or diet and nutrition specialist (dietitian) before starting any calcium supplements. Calcium supplements may interact with certain medicines. Too much calcium may cause other health problems, such as constipation and kidney stones. ?For the body to absorb calcium, it needs vitamin D. Sources of vitamin D include: ?Skin exposure to direct sunlight. ?Foods, such as egg yolks, liver, mushrooms, saltwater fish, and fortified milk. ?Vitamin D supplements. Check with your health care provider or dietitian before starting any vitamin D supplements. ?What foods are high in calcium? ? ?Foods that are high in calcium contain more than 100 milligrams per serving. ?Fruits ?Fortified orange juice or other fruit juice, 300 mg per 8 oz serving. ?Vegetables ?Collard greens, 360 mg per 8 oz serving. ?Kale, 100 mg per 8 oz serving. ?Bok choy, 160 mg per 8 oz serving. ?Grains ?Fortified ready-to-eat cereals, 100 to 1,000 mg per 8 oz serving. ?Fortified frozen waffles, 200 mg in 2 waffles. ?Oatmeal, 140 mg in  1 cup. ?Meats and other proteins ?Sardines, canned with bones, 325 mg per 3 oz serving. ?Salmon, canned with bones, 180 mg per 3 oz serving. ?Canned shrimp, 125 mg per 3 oz serving. ?Baked beans, 160 mg per 4 oz serving. ?Tofu, firm, made with calcium sulfate, 253 mg per 4 oz serving. ?Dairy ?Yogurt, plain, low-fat, 310 mg per 6 oz serving. ?Nonfat milk, 300 mg per 8 oz serving. ?American cheese, 195 mg per 1 oz serving. ?Cheddar cheese, 205 mg per 1 oz serving. ?Cottage cheese 2%, 105 mg per 4 oz serving. ?Fortified soy, rice, or almond milk, 300 mg per 8 oz serving. ?Mozzarella, part skim, 210 mg per 1 oz serving. ?The items listed above may not be a complete list of foods high in calcium. Actual amounts of calcium may be different depending on processing. Contact a dietitian for more information. ?What foods are lower in calcium? ?Foods that are lower in calcium contain 50 mg or less per serving. ?Fruits ?Apple, about 6 mg. ?Banana, about 12 mg. ?  Vegetables ?Lettuce, 19 mg per 2 oz serving. ?Tomato, about 11 mg. ?Grains ?Rice, 4 mg per 6 oz serving. ?Boiled potatoes, 14 mg per 8 oz serving. ?White bread, 6 mg per slice. ?Meats and other proteins ?Egg, 27 mg per 2 oz serving. ?Red meat, 7 mg per 4 oz serving. ?Chicken, 17 mg per 4 oz serving. ?Fish, cod, or trout, 20 mg per 4 oz serving. ?Dairy ?Cream cheese, regular, 14 mg per 1 Tbsp serving. ?Brie cheese, 50 mg per 1 oz serving. ?Parmesan cheese, 70 mg per 1 Tbsp serving. ?The items listed above may not be a complete list of foods lower in calcium. Actual amounts of calcium may be different depending on processing. Contact a dietitian for more information. ?Summary ?Calcium is an important mineral in the body because it affects many functions. Getting enough calcium helps support strong bones and teeth throughout your life. ?Try to get most of your calcium from food. ?Calcium supplements may interact with certain medicines. Check with your health care provider  or dietitian before starting any calcium supplements. ?This information is not intended to replace advice given to you by your health care provider. Make sure you discuss any questions you have with your h

## 2021-12-02 ENCOUNTER — Ambulatory Visit (INDEPENDENT_AMBULATORY_CARE_PROVIDER_SITE_OTHER): Payer: BC Managed Care – PPO | Admitting: Family Medicine

## 2021-12-02 LAB — CYTOLOGY - PAP
Comment: NEGATIVE
Diagnosis: NEGATIVE
High risk HPV: NEGATIVE

## 2021-12-08 ENCOUNTER — Telehealth (INDEPENDENT_AMBULATORY_CARE_PROVIDER_SITE_OTHER): Payer: BC Managed Care – PPO | Admitting: Psychology

## 2021-12-08 ENCOUNTER — Ambulatory Visit: Payer: BC Managed Care – PPO | Admitting: Obstetrics and Gynecology

## 2021-12-08 DIAGNOSIS — F5089 Other specified eating disorder: Secondary | ICD-10-CM

## 2021-12-08 DIAGNOSIS — F4323 Adjustment disorder with mixed anxiety and depressed mood: Secondary | ICD-10-CM

## 2021-12-16 ENCOUNTER — Encounter: Payer: Self-pay | Admitting: Obstetrics and Gynecology

## 2021-12-16 ENCOUNTER — Other Ambulatory Visit (HOSPITAL_COMMUNITY)
Admission: RE | Admit: 2021-12-16 | Discharge: 2021-12-16 | Disposition: A | Discharge status: Home / Self Care | Payer: BC Managed Care – PPO | Source: Ambulatory Visit | Attending: Obstetrics and Gynecology | Admitting: Obstetrics and Gynecology

## 2021-12-16 ENCOUNTER — Ambulatory Visit (INDEPENDENT_AMBULATORY_CARE_PROVIDER_SITE_OTHER): Payer: BC Managed Care – PPO | Admitting: Obstetrics and Gynecology

## 2021-12-16 DIAGNOSIS — N9089 Other specified noninflammatory disorders of vulva and perineum: Secondary | ICD-10-CM

## 2021-12-16 DIAGNOSIS — A630 Anogenital (venereal) warts: Secondary | ICD-10-CM | POA: Diagnosis not present

## 2021-12-16 MED ORDER — IBUPROFEN 800 MG PO TABS
800.0000 mg | ORAL_TABLET | Freq: Three times a day (TID) | ORAL | 3 refills | Status: DC | PRN
Start: 2021-12-16 — End: 2022-12-09

## 2021-12-16 NOTE — Progress Notes (Signed)
GYNECOLOGY  VISIT   HPI: 41 y.o.   Married  [place]  female   [phone] with Patient's last menstrual period was 11/27/2021 (exact date).   here for vulvar biopsy.    [place] interpretor present for a portion of the visit and was excused by the patient.   States she noticed a blood blister of her left vulva today.  It bled slightly.   Wants a refill of Motrin 800 mg to take if needed to reduce bleeding with menstrual cycle.   Does not need every month.   GYNECOLOGIC HISTORY: Patient's last menstrual period was 11/27/2021 (exact date). Contraception:  Paragard IUD 05/2019 Menopausal hormone therapy:  none Last mammogram:  07-12-19 Diag.Bil.w/US--Stable 0.8 cm mass within the INNER RIGHT breast, compatible with a benign mass/fibroadenoma. Last pap smear: 12-01-21 Neg:Neg HR HPV, 07/2017 normal per patient        OB History     Gravida  1   Para  1   Term  1   Preterm  0   AB  0   Living  1      SAB  0   IAB  0   Ectopic  0   Multiple  0   Live Births  1              Patient Active Problem List   Diagnosis Date Noted   Postpartum care following cesarean delivery (9/22) 04/12/2019   Encounter for induction of labor 04/11/2019   Delivery by emergency cesarean 04/11/2019   Bilateral ovarian cysts 12/12/2017    Past Medical History:  Diagnosis Date   AMA (advanced maternal age) primigravida 35+, unspecified trimester    Anemia    Anxiety    B12 deficiency    Back pain    Bilateral ovarian cysts    Chronic constipation    Constipation    Depression    Edema of both lower extremities    Endometriosis    Fatigue    Hx of breast implants, bilateral 2007   In [place], Silicone implants   Palpitations    SOB (shortness of breath) on exertion    Vitamin D deficiency     Past Surgical History:  Procedure Laterality Date   AUGMENTATION MAMMAPLASTY     Silicone implants--[place]   CESAREAN SECTION N/A 04/11/2019   Procedure: CESAREAN SECTION;   Surgeon: Shea Evans, MD;  Location: [facility]S;  Service: Obstetrics;  Laterality: N/A;    Current Outpatient Medications  Medication Sig Dispense Refill   escitalopram (LEXAPRO) 10 MG tablet Take 10 mg by mouth daily.     paragard intrauterine copper IUD IUD 1 each by Intrauterine route once.     Prenatal Vit-Fe Fumarate-FA (PRENATAL VITAMINS) 28-0.8 MG TABS Take 1 tablet by mouth daily. 30 tablet 0   Vitamin D, Ergocalciferol, (DRISDOL) 1.25 MG (50000 UNIT) CAPS capsule Take 1 capsule (50,000 Units total) by mouth every 7 (seven) days. 4 capsule 0   No current facility-administered medications for this visit.     ALLERGIES: Prozac [fluoxetine]  Family History  Problem Relation Age of Onset   Thyroid disease Mother    Hypertension Mother    Bipolar disorder Mother    Obesity Mother    Diabetes Maternal Grandfather    Cancer Paternal Grandmother     Social History   Socioeconomic History   Marital status: Married    Spouse name: Rulon Sera   Number of children: 1   Years of education: Not on  file   Highest education level: Not on file  Occupational History   Occupation: Stay at home Spouse  Tobacco Use   Smoking status: Never   Smokeless tobacco: Never  Vaping Use   Vaping Use: Never used  Substance and Sexual Activity   Alcohol use: Not Currently   Drug use: No   Sexual activity: Yes    Partners: Male    Birth control/protection: I.U.D.    Comment: Paragard 05/2019  Other Topics Concern   Not on file  Social History Narrative   Not on file   Social Determinants of Health   Financial Resource Strain: Not on file  Food Insecurity: Not on file  Transportation Needs: Not on file  Physical Activity: Not on file  Stress: Not on file  Social Connections: Not on file  Intimate Partner Violence: Not on file    Review of Systems  All other systems reviewed and are negative.  PHYSICAL EXAMINATION:    BP 110/70   Ht 5\' 5"  (1.651 m)   Wt 193 lb (87.5 kg)    LMP 11/27/2021 (Exact Date)   BMI 32.12 kg/m     General appearance: alert, cooperative and appears stated age   Pelvic: External genitalia:  right inferior labia majora with 3 - 4 mm white raised lesion.   Left lateral labia majora with a blood blister noted.     Right vulva biopsy Consent done. Sterile betadine prep.  Local 1% lidocaine, lot 01/27/2022, exp 11/18/22.  4 mm punch used to remove the lesion, sent to pathology.  Single suture of 3/0 Vicryl.  Minimal EBL.  No complications.   Chaperone was present for exam:  01/18/23, CMA  ASSESSMENT  Right vulva lesion.  Left labia majora with blood blister. Heavy menses.   PLAN  Fu biopsy result. Motrin 800 mg po q 8 hours prn.  I did discuss the interaction of Lexapro and Motrin.  She will limit the use of the Motrin.     An After Visit Summary was printed and given to the patient.

## 2021-12-16 NOTE — Patient Instructions (Signed)
Vulva Biopsy, Care After After a vulva biopsy, it is common to have: Slight bleeding from the biopsy site. Soreness or slight pain at the biopsy site. Follow these instructions at home: Your doctor may give you more instructions. If you have problems, contact your doctor. Biopsy site care  Follow instructions from your doctor about how to take care of your biopsy site. Make sure you: Clean the area using water and mild soap two times a day or as told by your doctor. Gently pat the area dry. You may shower 24 hours after the procedure. If you were prescribed an antibiotic ointment, apply it as told by your doctor. Do not stop using the antibiotic even if your condition gets better. If told by your doctor, take a warm water bath (sitz bath) to help with pain and soreness. A sitz bath is taken while you are sitting down. Do this as often as told by your doctor. The water should only come up to your hips and cover your butt. You may pat the area dry with a soft, clean towel. Leave stitches (sutures) or skin glue in place for at least 2 weeks. Leave tape strips alone unless you are told to take them off. You may trim the edges of the tape strips if they curl up. Check your biopsy area every day for signs of infection. It may be helpful to use a handheld mirror to do this. Check for: Redness, swelling, or more pain. More fluid or blood. Warmth. Pus or a bad smell. Do not rub the biopsy area after peeing (urinating). Gently pat the area dry, or use a bottle filled with warm water (peri bottle) to clean the area. Gently wipe from front to back. Lifestyle Wear loose, cotton underwear. Do not wear tight pants. For at least 1 week or until your doctor says it is okay: Do not use a tampon, douche, or put anything in your vagina. Do not have sex. Until your doctor says it is okay: Do not exercise. Do not swim or use a hot tub. General instructions Take over-the-counter and prescription  medicines only as told by your doctor. Drink enough fluid to keep your pee (urine) pale yellow. Use a sanitary pad until the bleeding stops. If told, put ice on the biopsy site. To do this: Place ice in a plastic bag. Place a towel between your skin and the bag. Leave the ice on for 20 minutes, 2-3 times a day. Take off the ice if your skin turns bright red. This is very important. If you cannot feel pain, heat, or cold, you have a greater risk of damage to the area. Keep all follow-up visits. Contact a doctor if: You have redness, swelling, or more pain around your biopsy site. You have more fluid or blood coming from your biopsy site. Your biopsy site feels warm when you touch it. Medicines or ice packs do not help with your pain. You have a fever or chills. Get help right away if: You have a lot of bleeding from the vulva. You have pus or a bad smell coming from your biopsy site. You have pain in your belly (abdomen). Summary After the procedure, it is common to have slight bleeding and soreness at the biopsy site. Follow all instructions as told by your doctor. Take sitz baths as told by your doctor. Leave any stitches in place. Check your biopsy site for infection. Signs include redness, swelling, more pain, more fluid or blood, or warmth. Get help   right away if you have a lot of bleeding, pus or a bad smell, or pain in your belly. This information is not intended to replace advice given to you by your health care provider. Make sure you discuss any questions you have with your health care provider. Document Revised: 03/25/2021 Document Reviewed: 03/25/2021 Elsevier Patient Education  2023 [employer]  

## 2021-12-18 ENCOUNTER — Encounter (INDEPENDENT_AMBULATORY_CARE_PROVIDER_SITE_OTHER): Payer: Self-pay | Admitting: Family Medicine

## 2021-12-18 ENCOUNTER — Ambulatory Visit (INDEPENDENT_AMBULATORY_CARE_PROVIDER_SITE_OTHER): Payer: BC Managed Care – PPO | Admitting: Family Medicine

## 2021-12-18 VITALS — BP 109/69 | HR 68 | Temp 97.9°F | Ht 65.0 in | Wt 189.0 lb

## 2021-12-18 DIAGNOSIS — Z6830 Body mass index (BMI) 30.0-30.9, adult: Secondary | ICD-10-CM | POA: Diagnosis not present

## 2021-12-18 DIAGNOSIS — F4323 Adjustment disorder with mixed anxiety and depressed mood: Secondary | ICD-10-CM | POA: Diagnosis not present

## 2021-12-18 DIAGNOSIS — E559 Vitamin D deficiency, unspecified: Secondary | ICD-10-CM | POA: Diagnosis not present

## 2021-12-18 DIAGNOSIS — Z9189 Other specified personal risk factors, not elsewhere classified: Secondary | ICD-10-CM

## 2021-12-18 DIAGNOSIS — E669 Obesity, unspecified: Secondary | ICD-10-CM

## 2021-12-18 LAB — SURGICAL PATHOLOGY

## 2021-12-18 MED ORDER — SAXENDA 18 MG/3ML ~~LOC~~ SOPN: 0.6000 mg | PEN_INJECTOR | Freq: Every day | SUBCUTANEOUS | 0 refills | Status: DC

## 2021-12-18 MED ORDER — BD PEN NEEDLE NANO 2ND GEN 32G X 4 MM MISC: 1.0000 | Freq: Two times a day (BID) | 0 refills | Status: DC

## 2021-12-18 MED ORDER — ESCITALOPRAM OXALATE 20 MG PO TABS: 20.0000 mg | ORAL_TABLET | Freq: Every day | ORAL | 0 refills | Status: DC

## 2021-12-18 MED ORDER — VITAMIN D (ERGOCALCIFEROL) 1.25 MG (50000 UNIT) PO CAPS: 50000.0000 [IU] | ORAL_CAPSULE | ORAL | 0 refills | Status: DC

## 2021-12-22 ENCOUNTER — Encounter (INDEPENDENT_AMBULATORY_CARE_PROVIDER_SITE_OTHER): Payer: Self-pay

## 2021-12-22 ENCOUNTER — Telehealth (INDEPENDENT_AMBULATORY_CARE_PROVIDER_SITE_OTHER): Payer: Self-pay | Admitting: Family Medicine

## 2021-12-22 NOTE — Telephone Encounter (Signed)
Dr. Lawson Radar - Prior authorization approved for Saxenda. Effective: 11/19/2021 - 04/18/2022. Patient sent approval message via mychart.

## 2021-12-24 NOTE — Progress Notes (Signed)
Chief Complaint:   OBESITY Diane Matthews is here to discuss her progress with her obesity treatment plan along with follow-up of her obesity related diagnoses. Diane Matthews is on keeping a food journal and adhering to recommended goals of 1250-1400 calories and 85+ grams of protein and states she is following her eating plan approximately 0% of the time. Diane Matthews states she is walking 40 minutes 3 times per week.  Today's visit was #: 4 Starting weight: 189 lbs Starting date: 10/01/2021 Today's weight: 189 lbs Today's date: 12/18/2021 Total lbs lost to date: 0 lbs Total lbs lost since last in-office visit: 0  Interim History: Diane Matthews voices that she has traveled quite a bit and is not doing the best in terms pf meal plan. Thinks she is ending up with 1900 calories and getting about 100 grams of protein. She is doing some emotional eating due to stress, boredom and anger. She is doing grocery shopping and limiting indulgences brought into her house.  Subjective:   1. Vitamin D deficiency Diane Matthews is currently taking prescription Vit D 50,000 IU once a week. Denies any nausea, vomiting or muscle weakness. She notes fatigue. Her last Vit D level of 18.9.  2. Adjustment disorder with mixed anxiety and depressed mood Diane Matthews is currently taking Lexapro 10 mg daily. Denies suicidal ideas, and homicidal ideas but increase in symptoms since reaching steady state on 10 mg.   3. At risk for side effect of medication Diane Matthews is at risk for drug side effects due to starting and an increased medication..  Assessment/Plan:   1. Vitamin D deficiency We will refill Vit D 50,00 IU once a week for 1 month with 0 refills.  -Refill Vitamin D, Ergocalciferol, (DRISDOL) 1.25 MG (50000 UNIT) CAPS capsule; Take 1 capsule (50,000 Units total) by mouth every 7 (seven) days.  Dispense: 4 capsule; Refill: 0  2. Adjustment disorder with mixed anxiety and depressed mood We will INCREASE Lexapro to 20 mg daily for 1 month with  0 refills.  -INCREASES escitalopram (LEXAPRO) 20 MG tablet; Take 1 tablet (20 mg total) by mouth daily.  Dispense: 30 tablet; Refill: 0  3. At risk for side effect of medication Diane Matthews was given approximately 15 minutes of drug side effect counseling today.  We discussed side effect possibility and risk versus benefits. Diane Matthews agreed to the medication and will contact this office if these side effects are intolerable.  Repetitive spaced learning was employed today to elicit superior memory formation and behavioral change.  4. Obesity with current BMI of 30.6 START Saxenda 0.6 mg subcutaneous daily for 1 month with 0 refills.  -START Liraglutide -Weight Management (SAXENDA) 18 MG/3ML SOPN; Inject 0.6 mg into the skin daily.  Dispense: 3 mL; Refill: 0  -Fill Insulin Pen Needle (BD PEN NEEDLE NANO 2ND GEN) 32G X 4 MM MISC; 1 Package by Does not apply route 2 (two) times daily.  Dispense: 100 each; Refill: 0  Diane Matthews is currently in the action stage of change. As such, her goal is to continue with weight loss efforts. She has agreed to keeping a food journal and adhering to recommended goals of 1300-1400 calories and 85+ grams of protein daily.  Exercise goals: All adults should avoid inactivity. Some physical activity is better than none, and adults who participate in any amount of physical activity gain some health benefits.  Behavioral modification strategies: increasing lean protein intake, meal planning and cooking strategies, better snacking choices, and emotional eating strategies.  Diane Matthews has agreed  to follow-up with our clinic in 3 weeks. She was informed of the importance of frequent follow-up visits to maximize her success with intensive lifestyle modifications for her multiple health conditions.   Objective:   Blood pressure 109/69, pulse 68, temperature 97.9 F (36.6 C), height 5\' 5"  (1.651 m), weight 189 lb (85.7 kg), last menstrual period 11/25/2021, SpO2 98 %. Body mass index  is 31.45 kg/m.  General: Cooperative, alert, well developed, in no acute distress. HEENT: Conjunctivae and lids unremarkable. Cardiovascular: Regular rhythm.  Lungs: Normal work of breathing. Neurologic: No focal deficits.   Lab Results  Component Value Date   CREATININE 0.75 10/01/2021   BUN 15 10/01/2021   NA 137 10/01/2021   K 4.1 10/01/2021   CL 102 10/01/2021   CO2 23 10/01/2021   Lab Results  Component Value Date   ALT 11 10/01/2021   AST 10 10/01/2021   ALKPHOS 73 10/01/2021   BILITOT 0.4 10/01/2021   Lab Results  Component Value Date   HGBA1C 5.5 10/01/2021   Lab Results  Component Value Date   INSULIN 6.5 10/01/2021   Lab Results  Component Value Date   TSH 2.420 10/01/2021   Lab Results  Component Value Date   CHOL 155 10/01/2021   HDL 58 10/01/2021   LDLCALC 81 10/01/2021   TRIG 83 10/01/2021   CHOLHDL 2.7 10/01/2021   Lab Results  Component Value Date   VD25OH 18.9 (L) 10/01/2021   VD25OH 27.7 (L) 01/29/2020   Lab Results  Component Value Date   WBC 7.5 10/01/2021   HGB 12.8 10/01/2021   HCT 39.9 10/01/2021   MCV 91 10/01/2021   PLT 263 10/01/2021   No results found for: IRON, TIBC, FERRITIN  Attestation Statements:   Reviewed by clinician on day of visit: allergies, medications, problem list, medical history, surgical history, family history, social history, and previous encounter notes.  I, 10/03/2021, RMA am acting as transcriptionist for Fortino Sic, MD.  I have reviewed the above documentation for accuracy and completeness, and I agree with the above. - Reuben Likes, MD

## 2021-12-26 DIAGNOSIS — F321 Major depressive disorder, single episode, moderate: Secondary | ICD-10-CM | POA: Diagnosis not present

## 2021-12-26 DIAGNOSIS — G479 Sleep disorder, unspecified: Secondary | ICD-10-CM | POA: Diagnosis not present

## 2021-12-26 DIAGNOSIS — F411 Generalized anxiety disorder: Secondary | ICD-10-CM | POA: Diagnosis not present

## 2021-12-28 NOTE — Progress Notes (Signed)
  Office: [phone]  /  Fax: [phone]    Date: 01/06/2022   Appointment Start Time: 2:33pm Duration: 20 minutes Provider: Lawerance Cruel, Psy.D. Type of Session: Individual Therapy  Location of Patient: Home (private location) Location of Provider: Provider's Home (private office) Type of Contact: Telepsychological Visit via MyChart Video Visit  Session Content: Diane Matthews is a 41 y.o. female presenting for a follow-up appointment to address the previously established treatment goal of increasing coping skills.Today's appointment was a telepsychological visit. Diane Matthews provided verbal consent for today's telepsychological appointment and she is aware she is responsible for securing confidentiality on her end of the session. Prior to proceeding with today's appointment, Diane Matthews's physical location at the time of this appointment was obtained as well a phone number she could be reached at in the event of technical difficulties. Diane Matthews and this provider participated in today's telepsychological service.   This provider conducted a brief check-in. Dymin shared, "I'm feeling really better," adding her dose for Lexapro was increased. She further shared she is eating more at home and keeping track of what she is eating. Diane Matthews also discussed a reduction in emotional eating behaviors and noted using the plan developed at the last appointment. Notably, she discussed challenges with eating all food on her structured meal plan. Further explored and processed. Diane Matthews was engaged in problem solving. Diane Matthews was receptive to today's appointment as evidenced by openness to sharing, responsiveness to feedback, and willingness to continue engaging in learned skills.  Mental Status Examination:  Appearance: neat Behavior: appropriate to circumstances Mood: neutral Affect: mood congruent Speech: WNL Eye Contact: appropriate Psychomotor Activity: WNL Gait: unable to assess Thought Process: linear, logical, and  goal directed and no evidence or endorsement of suicidal, homicidal, and self-harm ideation, plan and intent  Thought Content/Perception: no hallucinations, delusions, bizarre thinking or behavior endorsed or observed Orientation: AAOx4 Memory/Concentration: memory, attention, language, and fund of knowledge intact  Insight: good Judgment: fair  Interventions:  Conducted a brief chart review Provided empathic reflections and validation Reviewed content from the previous session Employed supportive psychotherapy interventions to facilitate reduced distress and to improve coping skills with identified stressors Engaged patient in problem solving  DSM-5 Diagnosis(es):  F50.89 Other Specified Feeding or Eating Disorder, Emotional Eating Behaviors and F43.23 Adjustment Disorder, With Mixed Anxiety and Depressed Mood  Treatment Goal & Progress: During the initial appointment with this provider, the following treatment goal was established: increase coping skills. Talena has demonstrated progress in her goal as evidenced by increased awareness of hunger patterns, increased awareness of triggers for emotional eating behaviors, and reduction in emotional eating behaviors . Diane Matthews also continues to demonstrate willingness to engage in learned skill(s).  Plan: Per Diane Matthews's request, the next appointment is scheduled for 02/10/2022 at 2:30pm, which will be via MyChart Video Visit. The next session will focus on working towards the established treatment goal.

## 2022-01-06 ENCOUNTER — Telehealth (INDEPENDENT_AMBULATORY_CARE_PROVIDER_SITE_OTHER): Payer: BC Managed Care – PPO | Admitting: Psychology

## 2022-01-06 DIAGNOSIS — F5089 Other specified eating disorder: Secondary | ICD-10-CM

## 2022-01-06 DIAGNOSIS — F4323 Adjustment disorder with mixed anxiety and depressed mood: Secondary | ICD-10-CM | POA: Diagnosis not present

## 2022-01-10 ENCOUNTER — Other Ambulatory Visit (INDEPENDENT_AMBULATORY_CARE_PROVIDER_SITE_OTHER): Payer: Self-pay | Admitting: Family Medicine

## 2022-01-10 DIAGNOSIS — E538 Deficiency of other specified B group vitamins: Secondary | ICD-10-CM

## 2022-01-10 DIAGNOSIS — E559 Vitamin D deficiency, unspecified: Secondary | ICD-10-CM

## 2022-01-10 DIAGNOSIS — E669 Obesity, unspecified: Secondary | ICD-10-CM

## 2022-01-10 DIAGNOSIS — F4323 Adjustment disorder with mixed anxiety and depressed mood: Secondary | ICD-10-CM

## 2022-01-15 ENCOUNTER — Other Ambulatory Visit (INDEPENDENT_AMBULATORY_CARE_PROVIDER_SITE_OTHER): Payer: Self-pay | Admitting: Family Medicine

## 2022-01-15 DIAGNOSIS — F4323 Adjustment disorder with mixed anxiety and depressed mood: Secondary | ICD-10-CM

## 2022-01-21 ENCOUNTER — Ambulatory Visit (INDEPENDENT_AMBULATORY_CARE_PROVIDER_SITE_OTHER): Payer: BC Managed Care – PPO | Admitting: Family Medicine

## 2022-01-21 ENCOUNTER — Encounter (INDEPENDENT_AMBULATORY_CARE_PROVIDER_SITE_OTHER): Payer: Self-pay | Admitting: Family Medicine

## 2022-01-21 VITALS — BP 101/64 | HR 74 | Temp 98.0°F | Ht 65.0 in | Wt 190.0 lb

## 2022-01-21 DIAGNOSIS — E538 Deficiency of other specified B group vitamins: Secondary | ICD-10-CM | POA: Diagnosis not present

## 2022-01-21 DIAGNOSIS — E669 Obesity, unspecified: Secondary | ICD-10-CM | POA: Diagnosis not present

## 2022-01-21 DIAGNOSIS — F4323 Adjustment disorder with mixed anxiety and depressed mood: Secondary | ICD-10-CM | POA: Diagnosis not present

## 2022-01-21 DIAGNOSIS — E559 Vitamin D deficiency, unspecified: Secondary | ICD-10-CM

## 2022-01-21 DIAGNOSIS — Z6831 Body mass index (BMI) 31.0-31.9, adult: Secondary | ICD-10-CM

## 2022-01-21 MED ORDER — ESCITALOPRAM OXALATE 20 MG PO TABS: 20.0000 mg | ORAL_TABLET | Freq: Every day | ORAL | 0 refills | Status: DC

## 2022-01-21 MED ORDER — SAXENDA 18 MG/3ML ~~LOC~~ SOPN
3.0000 mg | PEN_INJECTOR | Freq: Every day | SUBCUTANEOUS | 0 refills | Status: DC
Start: 2022-01-21 — End: 2022-03-11

## 2022-01-21 MED ORDER — VITAMIN D (ERGOCALCIFEROL) 1.25 MG (50000 UNIT) PO CAPS: 50000.0000 [IU] | ORAL_CAPSULE | ORAL | 0 refills | Status: DC

## 2022-01-21 MED ORDER — PRENATAL VITAMINS 28-0.8 MG PO TABS: 1.0000 | ORAL_TABLET | Freq: Every day | ORAL | 0 refills | Status: DC

## 2022-01-22 NOTE — Progress Notes (Signed)
Chief Complaint:   OBESITY Diane Matthews is here to discuss her progress with her obesity treatment plan along with follow-up of her obesity related diagnoses. Diane Matthews is on the Category 2 Plan and states she is following her eating plan approximately 80% of the time. Diane Matthews states she is walking 30 minutes 7 times per week.  Today's visit was #: 5 Starting weight: 189 lbs Starting date: 10/01/2021 Today's weight: 190 lbs Today's date: 01/21/2022 Total lbs lost to date: 0 lbs Total lbs lost since last in-office visit: 0  Interim History: Diane Matthews started Saxenda after last appointment. She has made a daily check list of food intake daily. Feels more swollen from drinking a few cocktails on July 4th. Denies hunger. She has been on 0.6 mg of Saxenda. She did have vomiting 1st day. She has not been getting snack calories in.  Subjective:   1. Folate deficiency Diane Matthews is currently on Prenatal vitamins daily. Her last Folate was 2.3 on 10/01/21.  2. Vitamin D deficiency Diane Matthews is currently taking prescription Vit D 50,000 IU once a week. Denies any nausea, vomiting or muscle weakness. She notes fatigue. Her last Vit D level of 18.9.  3. Adjustment disorder with mixed anxiety and depressed mood Diane Matthews reports improved symptoms;felling well on Lexapro. Denies suicidal ideas, and homicidal ideas.  Assessment/Plan:   1. Folate deficiency We will refill Prenatal vitamins for 3 months with 0 refills.   -Refill Prenatal Vit-Fe Fumarate-FA (PRENATAL VITAMINS) 28-0.8 MG TABS; Take 1 tablet by mouth daily.  Dispense: 90 tablet; Refill: 0  2. Vitamin D deficiency We will refill Vit D 50,000 IU once a week for 1 month with 0 refills.  -Refill Vitamin D, Ergocalciferol, (DRISDOL) 1.25 MG (50000 UNIT) CAPS capsule; Take 1 capsule (50,000 Units total) by mouth every 7 (seven) days.  Dispense: 4 capsule; Refill: 0  3. Adjustment disorder with mixed anxiety and depressed mood We will refill Lexapro 20 mg  by mouth daily for 1 month with 0 refills.  -Refill escitalopram (LEXAPRO) 20 MG tablet; Take 1 tablet (20 mg total) by mouth daily.  Dispense: 30 tablet; Refill: 0  4. Obesity with current BMI of 31.7 We will refill/increase Saxenda to 0.9 mg x 1 week then 1.2 mg SubQ once daily until next appointment for 1 month with 0 refills.  -Refill/Increase Liraglutide -Weight Management (SAXENDA) 18 MG/3ML SOPN; Inject 3 mg into the skin daily.  Dispense: 15 mL; Refill: 0  Diane Matthews is currently in the action stage of change. As such, her goal is to continue with weight loss efforts. She has agreed to the Category 2 Plan and the Pescatarian Plan.   Exercise goals: As is.  Behavioral modification strategies: increasing lean protein intake, meal planning and cooking strategies, keeping healthy foods in the home, planning for success, and keeping a strict food journal.  Diane Matthews has agreed to follow-up with our clinic in 3 weeks. She was informed of the importance of frequent follow-up visits to maximize her success with intensive lifestyle modifications for her multiple health conditions.   Objective:   Blood pressure 101/64, pulse 74, temperature 98 F (36.7 C), height 5\' 5"  (1.651 m), weight 190 lb (86.2 kg), SpO2 99 %. Body mass index is 31.62 kg/m.  General: Cooperative, alert, well developed, in no acute distress. HEENT: Conjunctivae and lids unremarkable. Cardiovascular: Regular rhythm.  Lungs: Normal work of breathing. Neurologic: No focal deficits.   Lab Results  Component Value Date   CREATININE 0.75 10/01/2021  BUN 15 10/01/2021   NA 137 10/01/2021   K 4.1 10/01/2021   CL 102 10/01/2021   CO2 23 10/01/2021   Lab Results  Component Value Date   ALT 11 10/01/2021   AST 10 10/01/2021   ALKPHOS 73 10/01/2021   BILITOT 0.4 10/01/2021   Lab Results  Component Value Date   HGBA1C 5.5 10/01/2021   Lab Results  Component Value Date   INSULIN 6.5 10/01/2021   Lab Results   Component Value Date   TSH 2.420 10/01/2021   Lab Results  Component Value Date   CHOL 155 10/01/2021   HDL 58 10/01/2021   LDLCALC 81 10/01/2021   TRIG 83 10/01/2021   CHOLHDL 2.7 10/01/2021   Lab Results  Component Value Date   VD25OH 18.9 (L) 10/01/2021   VD25OH 27.7 (L) 01/29/2020   Lab Results  Component Value Date   WBC 7.5 10/01/2021   HGB 12.8 10/01/2021   HCT 39.9 10/01/2021   MCV 91 10/01/2021   PLT 263 10/01/2021   No results found for: "IRON", "TIBC", "FERRITIN"  Attestation Statements:   Reviewed by clinician on day of visit: allergies, medications, problem list, medical history, surgical history, family history, social history, and previous encounter notes.  I, Fortino Sic, RMA am acting as transcriptionist for Reuben Likes, MD.  I have reviewed the above documentation for accuracy and completeness, and I agree with the above. - Reuben Likes, MD

## 2022-01-27 NOTE — Progress Notes (Unsigned)
  Office: [phone]  /  Fax: [phone]    Date: 02/10/2022   Appointment Start Time: *** Duration: *** minutes Provider: Lawerance Cruel, Psy.D. Type of Session: Individual Therapy  Location of Patient: {gbptloc:23249} (private location) Location of Provider: Provider's Home (private office) Type of Contact: Telepsychological Visit via MyChart Video Visit  Session Content: Essense is a 41 y.o. female presenting for a follow-up appointment to address the previously established treatment goal of increasing coping skills.Today's appointment was a telepsychological visit. Tarisa provided verbal consent for today's telepsychological appointment and she is aware she is responsible for securing confidentiality on her end of the session. Prior to proceeding with today's appointment, Tapanga's physical location at the time of this appointment was obtained as well a phone number she could be reached at in the event of technical difficulties. Rudene and this provider participated in today's telepsychological service.   This provider conducted a brief check-in. *** Lianna was receptive to today's appointment as evidenced by openness to sharing, responsiveness to feedback, and {gbreceptiveness:23401}.  Mental Status Examination:  Appearance: {Appearance:22431} Behavior: {Behavior:22445} Mood: {gbmood:21757} Affect: {Affect:22436} Speech: {Speech:22432} Eye Contact: {Eye Contact:22433} Psychomotor Activity: {Motor Activity:22434} Gait: {gbgait:23404} Thought Process: {thought process:22448}  Thought Content/Perception: {disturbances:22451} Orientation: {Orientation:22437} Memory/Concentration: {gbcognition:22449} Insight: {Insight:22446} Judgment: {Insight:22446}  Interventions:  {Interventions for Progress Notes:23405}  DSM-5 Diagnosis(es):  F50.89 Other Specified Feeding or Eating Disorder, Emotional Eating Behaviors and F43.23 Adjustment Disorder, With Mixed Anxiety and Depressed  Mood  Treatment Goal & Progress: During the initial appointment with this provider, the following treatment goal was established: increase coping skills. Charlesia has demonstrated progress in her goal as evidenced by {gbtxprogress:22839}. Tondalaya also {gbtxprogress2:22951}.  Plan: The next appointment is scheduled for *** at ***, which will be via MyChart Video Visit. The next session will focus on {Plan for Next Appointment:23400}.

## 2022-02-02 ENCOUNTER — Ambulatory Visit (INDEPENDENT_AMBULATORY_CARE_PROVIDER_SITE_OTHER): Payer: BC Managed Care – PPO

## 2022-02-02 DIAGNOSIS — Z23 Encounter for immunization: Secondary | ICD-10-CM

## 2022-02-02 NOTE — Progress Notes (Signed)
Patient in today for 2nd Gardasil injection.   Contraception: IUD/ Paragard LMP: 01/18/22  Last AEX: 12/01/21 with Dr. Edward Jolly   Injection given in Left deltoid. Patient tolerated shot well.   Patient informed next injection due in about 2 months.  Advised patient, if not on birth control, to return for next injection with cycle.   Routed to provider for final review.  Encounter closed.

## 2022-02-10 ENCOUNTER — Telehealth (INDEPENDENT_AMBULATORY_CARE_PROVIDER_SITE_OTHER): Payer: BC Managed Care – PPO | Admitting: Psychology

## 2022-02-16 ENCOUNTER — Other Ambulatory Visit (INDEPENDENT_AMBULATORY_CARE_PROVIDER_SITE_OTHER): Payer: Self-pay | Admitting: Family Medicine

## 2022-02-16 DIAGNOSIS — E559 Vitamin D deficiency, unspecified: Secondary | ICD-10-CM

## 2022-02-18 ENCOUNTER — Other Ambulatory Visit (INDEPENDENT_AMBULATORY_CARE_PROVIDER_SITE_OTHER): Payer: Self-pay | Admitting: Family Medicine

## 2022-02-18 DIAGNOSIS — E559 Vitamin D deficiency, unspecified: Secondary | ICD-10-CM

## 2022-02-21 ENCOUNTER — Other Ambulatory Visit (INDEPENDENT_AMBULATORY_CARE_PROVIDER_SITE_OTHER): Payer: Self-pay | Admitting: Family Medicine

## 2022-02-21 DIAGNOSIS — F4323 Adjustment disorder with mixed anxiety and depressed mood: Secondary | ICD-10-CM

## 2022-02-23 ENCOUNTER — Ambulatory Visit (INDEPENDENT_AMBULATORY_CARE_PROVIDER_SITE_OTHER): Payer: BC Managed Care – PPO | Admitting: Family Medicine

## 2022-02-25 ENCOUNTER — Encounter (INDEPENDENT_AMBULATORY_CARE_PROVIDER_SITE_OTHER): Payer: Self-pay

## 2022-02-26 ENCOUNTER — Other Ambulatory Visit: Payer: Self-pay | Admitting: Obstetrics and Gynecology

## 2022-02-26 DIAGNOSIS — Z1231 Encounter for screening mammogram for malignant neoplasm of breast: Secondary | ICD-10-CM

## 2022-03-11 ENCOUNTER — Ambulatory Visit (INDEPENDENT_AMBULATORY_CARE_PROVIDER_SITE_OTHER): Payer: BC Managed Care – PPO | Admitting: Family Medicine

## 2022-03-11 ENCOUNTER — Encounter (INDEPENDENT_AMBULATORY_CARE_PROVIDER_SITE_OTHER): Payer: Self-pay | Admitting: Family Medicine

## 2022-03-11 VITALS — BP 96/61 | HR 86 | Temp 98.5°F | Ht 65.0 in | Wt 185.0 lb

## 2022-03-11 DIAGNOSIS — E559 Vitamin D deficiency, unspecified: Secondary | ICD-10-CM | POA: Diagnosis not present

## 2022-03-11 DIAGNOSIS — F4323 Adjustment disorder with mixed anxiety and depressed mood: Secondary | ICD-10-CM | POA: Diagnosis not present

## 2022-03-11 DIAGNOSIS — E538 Deficiency of other specified B group vitamins: Secondary | ICD-10-CM | POA: Diagnosis not present

## 2022-03-11 DIAGNOSIS — E669 Obesity, unspecified: Secondary | ICD-10-CM

## 2022-03-11 DIAGNOSIS — Z6830 Body mass index (BMI) 30.0-30.9, adult: Secondary | ICD-10-CM

## 2022-03-11 MED ORDER — VITAMIN D (ERGOCALCIFEROL) 1.25 MG (50000 UNIT) PO CAPS: 50000.0000 [IU] | ORAL_CAPSULE | ORAL | 0 refills | Status: DC

## 2022-03-11 MED ORDER — PRENATAL VITAMINS 28-0.8 MG PO TABS: 1.0000 | ORAL_TABLET | Freq: Every day | ORAL | 0 refills | Status: DC

## 2022-03-11 MED ORDER — ESCITALOPRAM OXALATE 20 MG PO TABS: 20.0000 mg | ORAL_TABLET | Freq: Every day | ORAL | 0 refills | Status: DC

## 2022-03-11 MED ORDER — SAXENDA 18 MG/3ML ~~LOC~~ SOPN: 3.0000 mg | PEN_INJECTOR | Freq: Every day | SUBCUTANEOUS | 0 refills | Status: DC

## 2022-03-17 ENCOUNTER — Telehealth (INDEPENDENT_AMBULATORY_CARE_PROVIDER_SITE_OTHER): Payer: BC Managed Care – PPO | Admitting: Psychology

## 2022-03-17 DIAGNOSIS — F4323 Adjustment disorder with mixed anxiety and depressed mood: Secondary | ICD-10-CM | POA: Diagnosis not present

## 2022-03-17 DIAGNOSIS — F5089 Other specified eating disorder: Secondary | ICD-10-CM

## 2022-03-17 NOTE — Progress Notes (Signed)
  Office: [phone]  /  Fax: [phone]    Date: March 17, 2022    Appointment Start Time: 2:00pm Duration: 21 minutes Provider: Lawerance Cruel, Psy.D. Type of Session: Individual Therapy  Location of Patient: Home (private location) Location of Provider: Provider's Home (private office) Type of Contact: Telepsychological Visit via MyChart Video Visit  Session Content: Diane Matthews is a 41 y.o. female presenting for a follow-up appointment to address the previously established treatment goal of increasing coping skills.Today's appointment was a telepsychological visit. Diane Matthews provided verbal consent for today's telepsychological appointment and she is aware she is responsible for securing confidentiality on her end of the session. Prior to proceeding with today's appointment, Diane Matthews's physical location at the time of this appointment was obtained as well a phone number she could be reached at in the event of technical difficulties. Diane Matthews and this provider participated in today's telepsychological service.   This provider conducted a brief check-in. Diane Matthews shared she lost weight and is feeling better. Regarding emotional eating behaviors, she continued to report a reduction. Psychoeducation regarding mindfulness was provided to further assist with coping. A handout was provided to Diane Matthews with further information regarding mindfulness, including exercises. This provider also explained the benefit of mindfulness as it relates to emotional eating. Diane Matthews was encouraged to engage in the provided exercises between now and the next appointment with this provider. Diane Matthews agreed. During today's appointment, Diane Matthews was led through a mindfulness exercise involving her senses. Diane Matthews provided verbal consent during today's appointment for this provider to send a handout about mindfulness via e-mail. Furthermore, termination planning was discussed. Diane Matthews was receptive to a follow-up appointment in 3-4 weeks and an  additional follow-up/termination appointment in 3-4 weeks after that. Diane Matthews was receptive to today's appointment as evidenced by openness to sharing, responsiveness to feedback, and willingness to engage in mindfulness exercises to assist with coping.  Mental Status Examination:  Appearance: neat Behavior: appropriate to circumstances Mood: neutral Affect: mood congruent Speech: WNL Eye Contact: appropriate Psychomotor Activity: WNL Gait: unable to assess Thought Process: linear, logical, and goal directed and no evidence or endorsement of suicidal, homicidal, and self-harm ideation, plan and intent  Thought Content/Perception: no hallucinations, delusions, bizarre thinking or behavior endorsed or observed Orientation: AAOx4 Memory/Concentration: memory, attention, language, and fund of knowledge intact  Insight: good Judgment: good  Interventions:  Conducted a brief chart review Provided empathic reflections and validation Employed supportive psychotherapy interventions to facilitate reduced distress and to improve coping skills with identified stressors Psychoeducation provided regarding mindfulness Engaged patient in mindfulness exercise(s) Discussed termination planning  DSM-5 Diagnosis(es):  F50.89 Other Specified Feeding or Eating Disorder, Emotional Eating Behaviors and F43.23 Adjustment Disorder, With Mixed Anxiety and Depressed Mood  Treatment Goal & Progress: During the initial appointment with this provider, the following treatment goal was established: increase coping skills. Diane Matthews has demonstrated progress in her goal as evidenced by increased awareness of hunger patterns, increased awareness of triggers for emotional eating behaviors, and reduction in emotional eating behaviors . Diane Matthews also continues to demonstrate willingness to engage in learned skill(s).  Plan: The next appointment is scheduled for 04/14/2022 at 2pm, which will be via MyChart Video Visit. The next  session will focus further on mindfulness and working towards the established treatment goal. Diane Matthews will continue meeting with her primary therapist.

## 2022-03-23 NOTE — Progress Notes (Signed)
Chief Complaint:   OBESITY Diane Matthews is here to discuss her progress with her obesity treatment plan along with follow-up of her obesity related diagnoses. Diane Matthews is on the Category 2 Plan and the Pescatarian Plan and states she is following her eating plan approximately 80% of the time. Diane Matthews states she is walking 45 minutes 3 times per week.  Today's visit was #: 6 Starting weight: 189 lbs Starting date: 10/01/2021 Today's weight: 185 lbs Today's date: 03/23/2022 Total lbs lost to date: 5 lbs Total lbs lost since last in-office visit: 5 lbs  Interim History: Diane Matthews is feeling better than she was previously. She is taking notes and being mindful that she does occasionally ski one meal. She does wonder if she is sabotaging herself. Diane Matthews is averaging 1300 calories daily and getting about 50 grams of protein daily. She does not have any upcoming plans except her son's birthday and party.  Subjective:   1. Vitamin D deficiency She is currently taking prescription vitamin D 50,000 IU each week.  Her last Vitamin D level was 18.9 on 10/01/2021  Lab Results  Component Value Date   VD25OH 18.9 (L) 10/01/2021   VD25OH 27.7 (L) 01/29/2020   2. Folate deficiency Diane Matthews is currently taking prenatal vitamins. Her last labs were in March 2023 and her level was 2.3.  3. Adjustment disorder with mixed anxiety and depressed mood Diane Matthews is currently taking Lexapro and with good control of symptoms. Diane Matthews reports no suicidal or homicidal ideations.  Assessment/Plan:   1. Vitamin D deficiency Will refill Vitamin D as follows: - Vitamin D, Ergocalciferol, (DRISDOL) 1.25 MG (50000 UNIT) CAPS capsule; Take 1 capsule (50,000 Units total) by mouth every 7 (seven) days.  Dispense: 4 capsule; Refill: 0  2. Folate deficiency Will refill Prenatal Vitamin as follows: - Prenatal Vit-Fe Fumarate-FA (PRENATAL VITAMINS) 28-0.8 MG TABS; Take 1 tablet by mouth daily.  Dispense: 90 tablet; Refill: 0  3.  Adjustment disorder with mixed anxiety and depressed mood Will refill Lexapro as follows: - escitalopram (LEXAPRO) 20 MG tablet; Take 1 tablet (20 mg total) by mouth daily.  Dispense: 30 tablet; Refill: 0  4. Obesity with current BMI of 30.8 Diane Matthews is currently in the action stage of change. As such, her goal is to continue with weight loss efforts. She has agreed to keeping a food journal and adhering to recommended goals of 1300-1400 calories and 85+ protein.   Exercise goals: All adults should avoid inactivity. Some physical activity is better than none, and adults who participate in any amount of physical activity gain some health benefits.  Diane Matthews will do 15 minutes of resistance training 2-3 times weekly.  Behavioral modification strategies: increasing lean protein intake, meal planning and cooking strategies, keeping healthy foods in the home, and planning for success.  Diane Matthews has agreed to follow-up with our clinic in 4 weeks. She was informed of the importance of frequent follow-up visits to maximize her success with intensive lifestyle modifications for her multiple health conditions.   Objective:   Blood pressure 96/61, pulse 86, temperature 98.5 F (36.9 C), height 5\' 5"  (1.651 m), weight 185 lb (83.9 kg), last menstrual period 03/08/2022, SpO2 97 %. Body mass index is 30.79 kg/m.  General: Cooperative, alert, well developed, in no acute distress. HEENT: Conjunctivae and lids unremarkable. Cardiovascular: Regular rhythm.  Lungs: Normal work of breathing. Neurologic: No focal deficits.   Lab Results  Component Value Date   CREATININE 0.75 10/01/2021   BUN 15 10/01/2021  NA 137 10/01/2021   K 4.1 10/01/2021   CL 102 10/01/2021   CO2 23 10/01/2021   Lab Results  Component Value Date   ALT 11 10/01/2021   AST 10 10/01/2021   ALKPHOS 73 10/01/2021   BILITOT 0.4 10/01/2021   Lab Results  Component Value Date   HGBA1C 5.5 10/01/2021   Lab Results  Component  Value Date   INSULIN 6.5 10/01/2021   Lab Results  Component Value Date   TSH 2.420 10/01/2021   Lab Results  Component Value Date   CHOL 155 10/01/2021   HDL 58 10/01/2021   LDLCALC 81 10/01/2021   TRIG 83 10/01/2021   CHOLHDL 2.7 10/01/2021   Lab Results  Component Value Date   VD25OH 18.9 (L) 10/01/2021   VD25OH 27.7 (L) 01/29/2020   Lab Results  Component Value Date   WBC 7.5 10/01/2021   HGB 12.8 10/01/2021   HCT 39.9 10/01/2021   MCV 91 10/01/2021   PLT 263 10/01/2021   No results found for: "IRON", "TIBC", "FERRITIN"  Attestation Statements:   Reviewed by clinician on day of visit: allergies, medications, problem list, medical history, surgical history, family history, social history, and previous encounter notes.  IPaulla Fore, CMA, am acting as transcriptionist for Dr. Reuben Likes, MD.   I have reviewed the above documentation for accuracy and completeness, and I agree with the above. - Reuben Likes, MD

## 2022-03-25 ENCOUNTER — Telehealth (INDEPENDENT_AMBULATORY_CARE_PROVIDER_SITE_OTHER): Payer: Self-pay | Admitting: Family Medicine

## 2022-03-25 ENCOUNTER — Encounter (INDEPENDENT_AMBULATORY_CARE_PROVIDER_SITE_OTHER): Payer: Self-pay

## 2022-03-25 NOTE — Telephone Encounter (Signed)
Dr. Lawson Radar - Prior authorization approved for Saxenda. Effective: 02/22/2022 to 03/24/2023. Patient sent approval message via mychart.

## 2022-03-26 ENCOUNTER — Ambulatory Visit
Admission: RE | Admit: 2022-03-26 | Discharge: 2022-03-26 | Disposition: A | Discharge status: Home / Self Care | Payer: BC Managed Care – PPO | Source: Ambulatory Visit | Attending: Obstetrics and Gynecology | Admitting: Obstetrics and Gynecology

## 2022-03-26 DIAGNOSIS — Z1231 Encounter for screening mammogram for malignant neoplasm of breast: Secondary | ICD-10-CM | POA: Diagnosis not present

## 2022-04-08 ENCOUNTER — Ambulatory Visit (INDEPENDENT_AMBULATORY_CARE_PROVIDER_SITE_OTHER): Payer: BC Managed Care – PPO | Admitting: Family Medicine

## 2022-04-08 ENCOUNTER — Encounter (INDEPENDENT_AMBULATORY_CARE_PROVIDER_SITE_OTHER): Payer: Self-pay | Admitting: Family Medicine

## 2022-04-08 VITALS — BP 102/65 | HR 72 | Temp 98.1°F | Ht 65.0 in | Wt 187.0 lb

## 2022-04-08 DIAGNOSIS — E669 Obesity, unspecified: Secondary | ICD-10-CM | POA: Diagnosis not present

## 2022-04-08 DIAGNOSIS — F4323 Adjustment disorder with mixed anxiety and depressed mood: Secondary | ICD-10-CM | POA: Diagnosis not present

## 2022-04-08 DIAGNOSIS — Z6831 Body mass index (BMI) 31.0-31.9, adult: Secondary | ICD-10-CM

## 2022-04-08 DIAGNOSIS — E559 Vitamin D deficiency, unspecified: Secondary | ICD-10-CM | POA: Diagnosis not present

## 2022-04-08 MED ORDER — ESCITALOPRAM OXALATE 20 MG PO TABS: 20.0000 mg | ORAL_TABLET | Freq: Every day | ORAL | 0 refills | Status: DC

## 2022-04-08 MED ORDER — VITAMIN D (ERGOCALCIFEROL) 1.25 MG (50000 UNIT) PO CAPS: 50000.0000 [IU] | ORAL_CAPSULE | ORAL | 0 refills | Status: DC

## 2022-04-09 NOTE — Progress Notes (Signed)
Chief Complaint:   OBESITY Diane Matthews is here to discuss her progress with her obesity treatment plan along with follow-up of her obesity related diagnoses. Diane Matthews is on keeping a food journal and adhering to recommended goals of 1300-1400 calories and 85+ grams of protein and states she is following her eating plan approximately 50% of the time. Diane Matthews states she is walking 45 minutes 5 times per week.  Today's visit was #: 7 Starting weight: 189 lbs Starting date: 10/01/2021 Today's weight: 187 lbs Today's date: 04/08/2022 Total lbs lost to date: 2 lbs Total lbs lost since last in-office visit: 0  Interim History: Diane Matthews had a difficult last 2 weeks--son was sick 2x--1x with virud and 1 x with H/F?mouth. She is sleeping better now so now trying to get back to meal plan. Trying to incorporate more activity; got resistance bands. Recognizes she needs to focus more on protein intake. Diane Matthews is out of stock.  Subjective:   1. Vitamin D deficiency Diane Matthews is currently taking prescription Vit D 50,000 IU once a week. Denies any nausea, vomiting or muscle weakness. She notes fatigue. Her Vit D level of 18.9.  2. Adjustment disorder with mixed anxiety and depressed mood Diane Matthews is on Lexapro 20 mg. Denies suicidal ideas, and homicidal ideas.  Assessment/Plan:   1. Vitamin D deficiency We will refill Vit D 50k IU once a week for 1 month with 0 refills.   -Refill Vitamin D, Ergocalciferol, (DRISDOL) 1.25 MG (50000 UNIT) CAPS capsule; Take 1 capsule (50,000 Units total) by mouth every 7 (seven) days.  Dispense: 4 capsule; Refill: 0  2. Adjustment disorder with mixed anxiety and depressed mood We will refill Lexapro 20 mg by mouth daily for 3 months with 0 refills.  -Refill escitalopram (LEXAPRO) 20 MG tablet; Take 1 tablet (20 mg total) by mouth daily.  Dispense: 90 tablet; Refill: 0  3. Obesity with current BMI of 31.1 Diane Matthews is currently in the action stage of change. As such, her goal  is to continue with weight loss efforts. She has agreed to keeping a food journal and adhering to recommended goals of 1300-1400 calories and 85+ grams of protein daily.   Exercise goals: All adults should avoid inactivity. Some physical activity is better than none, and adults who participate in any amount of physical activity gain some health benefits.  Behavioral modification strategies: increasing lean protein intake, meal planning and cooking strategies, keeping healthy foods in the home, and planning for success.  Diane Matthews has agreed to follow-up with our clinic in 3 weeks. She was informed of the importance of frequent follow-up visits to maximize her success with intensive lifestyle modifications for her multiple health conditions.   Objective:   Blood pressure 102/65, pulse 72, temperature 98.1 F (36.7 C), height 5\' 5"  (1.651 m), weight 187 lb (84.8 kg), last menstrual period 04/04/2022, SpO2 98 %. Body mass index is 31.12 kg/m.  General: Cooperative, alert, well developed, in no acute distress. HEENT: Conjunctivae and lids unremarkable. Cardiovascular: Regular rhythm.  Lungs: Normal work of breathing. Neurologic: No focal deficits.   Lab Results  Component Value Date   CREATININE 0.75 10/01/2021   BUN 15 10/01/2021   NA 137 10/01/2021   K 4.1 10/01/2021   CL 102 10/01/2021   CO2 23 10/01/2021   Lab Results  Component Value Date   ALT 11 10/01/2021   AST 10 10/01/2021   ALKPHOS 73 10/01/2021   BILITOT 0.4 10/01/2021   Lab Results  Component  Value Date   HGBA1C 5.5 10/01/2021   Lab Results  Component Value Date   INSULIN 6.5 10/01/2021   Lab Results  Component Value Date   TSH 2.420 10/01/2021   Lab Results  Component Value Date   CHOL 155 10/01/2021   HDL 58 10/01/2021   LDLCALC 81 10/01/2021   TRIG 83 10/01/2021   CHOLHDL 2.7 10/01/2021   Lab Results  Component Value Date   VD25OH 18.9 (L) 10/01/2021   VD25OH 27.7 (L) 01/29/2020   Lab Results   Component Value Date   WBC 7.5 10/01/2021   HGB 12.8 10/01/2021   HCT 39.9 10/01/2021   MCV 91 10/01/2021   PLT 263 10/01/2021   No results found for: "IRON", "TIBC", "FERRITIN"  Attestation Statements:   Reviewed by clinician on day of visit: allergies, medications, problem list, medical history, surgical history, family history, social history, and previous encounter notes.  I, Fortino Sic, RMA am acting as transcriptionist for Reuben Likes, MD.  I have reviewed the above documentation for accuracy and completeness, and I agree with the above. - Reuben Likes, MD

## 2022-04-14 ENCOUNTER — Telehealth (INDEPENDENT_AMBULATORY_CARE_PROVIDER_SITE_OTHER): Payer: BC Managed Care – PPO | Admitting: Psychology

## 2022-04-14 DIAGNOSIS — F4323 Adjustment disorder with mixed anxiety and depressed mood: Secondary | ICD-10-CM | POA: Diagnosis not present

## 2022-04-14 DIAGNOSIS — F5089 Other specified eating disorder: Secondary | ICD-10-CM | POA: Diagnosis not present

## 2022-04-14 NOTE — Progress Notes (Signed)
  Office: [phone]  /  Fax: [phone]    Date: April 14, 2022    Appointment Start Time: 2:01pm Duration: 23 minutes Provider: Glennie Isle, Psy.D. Type of Session: Individual Therapy  Location of Patient: Home (private location) Location of Provider: Provider's Home (private office) Type of Contact: Telepsychological Visit via MyChart Video Visit  Session Content: Diane Matthews is a 41 y.o. female presenting for a follow-up appointment to address the previously established treatment goal of increasing coping skills.Today's appointment was a telepsychological visit. Diane Matthews provided verbal consent for today's telepsychological appointment and she is aware she is responsible for securing confidentiality on her end of the session. Prior to proceeding with today's appointment, Diane Matthews's physical location at the time of this appointment was obtained as well a phone number she could be reached at in the event of technical difficulties. Kanita and this provider participated in today's telepsychological service.   This provider conducted a brief check-in. Diane Matthews noted, "I didn't have good progress [referring to eating habits and weight loss] last month" due to illness. Further explored and processed. She acknowledged experiencing "some frustration." Associated thoughts and feelings were processed. Due to recent eating-related challenges, today's appointment focused on goal setting to help her get back on track. Psychoeducation regarding SMART goals was provided and Diane Matthews was engaged in goal setting. The following goal was established: Diane Matthews will journal her food intake using [employer] and achieve her calorie/protein goals at least 3 out of 7 days a week between now and the next appointment with this provider.  Possible obstacles/barriers were explored and processed. Overall, Diane Matthews was receptive to today's appointment as evidenced by openness to sharing, responsiveness to feedback, and willingness to  work toward the established SMART goal.  Mental Status Examination:  Appearance: neat Behavior: appropriate to circumstances Mood: neutral Affect: mood congruent Speech: WNL Eye Contact: appropriate Psychomotor Activity: WNL Gait: unable to assess Thought Process: linear, logical, and goal directed and no evidence or endorsement of suicidal, homicidal, and self-harm ideation, plan and intent  Thought Content/Perception: no hallucinations, delusions, bizarre thinking or behavior endorsed or observed Orientation: AAOx4 Memory/Concentration: memory, attention, language, and fund of knowledge intact  Insight: good Judgment: fair  Interventions:  Conducted a brief chart review Provided empathic reflections and validation Employed supportive psychotherapy interventions to facilitate reduced distress and to improve coping skills with identified stressors Engaged patient in goal setting Psychoeducation provided regarding SMART goals  DSM-5 Diagnosis(es):  F50.89 Other Specified Feeding or Eating Disorder, Emotional Eating Behaviors and F43.23 Adjustment Disorder, With Mixed Anxiety and Depressed Mood  Treatment Goal & Progress: During the initial appointment with this provider, the following treatment goal was established: increase coping skills. Diane Matthews has demonstrated progress in her goal as evidenced by increased awareness of hunger patterns, increased awareness of triggers for emotional eating behaviors, and reduction in emotional eating behaviors . Diane Matthews also continues to demonstrate willingness to engage in learned skill(s).  Plan: Based on recent challenges, the next appointment is scheduled for 04/28/2022 at 2pm, which will be via [place] Visit. The next session will focus on working towards the established treatment goal. Diane Matthews will continue with her primary therapist.

## 2022-04-24 DIAGNOSIS — Z9882 Breast implant status: Secondary | ICD-10-CM | POA: Diagnosis not present

## 2022-04-24 DIAGNOSIS — Z01818 Encounter for other preprocedural examination: Secondary | ICD-10-CM | POA: Diagnosis not present

## 2022-04-28 ENCOUNTER — Telehealth (INDEPENDENT_AMBULATORY_CARE_PROVIDER_SITE_OTHER): Payer: BC Managed Care – PPO | Admitting: Psychology

## 2022-04-28 DIAGNOSIS — F4323 Adjustment disorder with mixed anxiety and depressed mood: Secondary | ICD-10-CM

## 2022-04-28 DIAGNOSIS — F5089 Other specified eating disorder: Secondary | ICD-10-CM | POA: Diagnosis not present

## 2022-04-28 NOTE — Progress Notes (Signed)
  Office: [phone]  /  Fax: [phone]    Date: April 28, 2022    Appointment Start Time: 2:03pm Duration: 19 minutes Provider: Glennie Isle, Psy.D. Type of Session: Individual Therapy  Location of Patient: Home (private location) Location of Provider: Provider's Home (private office) Type of Contact: Telepsychological Visit via MyChart Video Visit  Session Content: Diane Matthews is a 41 y.o. female presenting for a follow-up appointment to address the previously established treatment goal of increasing coping skills.Today's appointment was a telepsychological visit. Diane Matthews provided verbal consent for today's telepsychological appointment and she is aware she is responsible for securing confidentiality on her end of the session. Prior to proceeding with today's appointment, Diane Matthews's physical location at the time of this appointment was obtained as well a phone number she could be reached at in the event of technical difficulties. Diane Matthews and this provider participated in today's telepsychological service.   This provider conducted a brief check-in. Diane Matthews reported "feel[ing] better" since the last appointment with this provider. She shared she is going to [place] on October 26th for 45 days for medical concerns and to visit family/friends. Reviewed previously established SMART goal. Diane Matthews stated she met her goal and also discussed engagement in physical activity. She continues to report a reduction emotional eating behaviors. Positive reinforcement was provided. Psychoeducation regarding making better choices and engaging in portion control during holidays/celebrations/vacations was provided due to her upcoming trip to [place]. More specifically, this provider discussed the following strategies: coming to meals hungry, but not starving; avoid filling up on appetizers; managing portion sizes; not completely depriving yourself; making the plate colorful (e.g., vegetables); pacing yourself (e.g., waiting  10 minutes before going back for seconds); taking advantage of the nutritious foods; practicing mindfulness; staying hydrated; and avoid bringing home leftovers. Overall, Diane Matthews was receptive to today's appointment as evidenced by openness to sharing, responsiveness to feedback, and willingness to implement discussed strategies .  Mental Status Examination:  Appearance: neat Behavior: appropriate to circumstances Mood: neutral Affect: mood congruent Speech: WNL Eye Contact: appropriate Psychomotor Activity: WNL Gait: unable to assess Thought Process: linear, logical, and goal directed and no evidence or endorsement of suicidal, homicidal, and self-harm ideation, plan and intent  Thought Content/Perception: no hallucinations, delusions, bizarre thinking or behavior endorsed or observed Orientation: AAOx4 Memory/Concentration: memory, attention, language, and fund of knowledge intact  Insight: good Judgment: good  Interventions:  Conducted a brief chart review Provided empathic reflections and validation Reviewed content from the previous session Provided positive reinforcement Employed supportive psychotherapy interventions to facilitate reduced distress and to improve coping skills with identified stressors Psychoeducation provided regarding strategies for celebrations/holidays/vacations  DSM-5 Diagnosis(es):  F50.89 Other Specified Feeding or Eating Disorder, Emotional Eating Behaviors and F43.23 Adjustment Disorder, With Mixed Anxiety and Depressed Mood  Treatment Goal & Progress: During the initial appointment with this provider, the following treatment goal was established: increase coping skills. Diane Matthews demonstrated progress in her goal as evidenced by increased awareness of hunger patterns, increased awareness of triggers for emotional eating behaviors, and reduction in emotional eating behaviors . Diane Matthews also continues to demonstrate willingness to engage in learned  skill(s).  Plan: As previously planned, today was Diane Matthews's last appointment with this provider. She will continue meeting with her primary therapist. She acknowledged understanding that she may request a follow-up appointment with this provider in the future as long as she is still established with the clinic. No further follow-up planned by this provider.

## 2022-05-06 ENCOUNTER — Ambulatory Visit (INDEPENDENT_AMBULATORY_CARE_PROVIDER_SITE_OTHER): Payer: BC Managed Care – PPO | Admitting: Family Medicine

## 2022-07-11 ENCOUNTER — Other Ambulatory Visit (INDEPENDENT_AMBULATORY_CARE_PROVIDER_SITE_OTHER): Payer: Self-pay | Admitting: Family Medicine

## 2022-07-11 DIAGNOSIS — F4323 Adjustment disorder with mixed anxiety and depressed mood: Secondary | ICD-10-CM

## 2022-07-31 DIAGNOSIS — F321 Major depressive disorder, single episode, moderate: Secondary | ICD-10-CM | POA: Diagnosis not present

## 2022-07-31 DIAGNOSIS — Z13220 Encounter for screening for lipoid disorders: Secondary | ICD-10-CM | POA: Diagnosis not present

## 2022-07-31 DIAGNOSIS — F411 Generalized anxiety disorder: Secondary | ICD-10-CM | POA: Diagnosis not present

## 2022-07-31 DIAGNOSIS — Z0000 Encounter for general adult medical examination without abnormal findings: Secondary | ICD-10-CM | POA: Diagnosis not present

## 2022-10-09 ENCOUNTER — Other Ambulatory Visit (INDEPENDENT_AMBULATORY_CARE_PROVIDER_SITE_OTHER): Payer: Self-pay | Admitting: Family Medicine

## 2022-10-09 DIAGNOSIS — E538 Deficiency of other specified B group vitamins: Secondary | ICD-10-CM

## 2022-11-25 NOTE — Progress Notes (Unsigned)
42 y.o. G72P1001 Married Caucasian female here for annual exam.  \  She states she thinks she has a rectus muscle diathesis.   Has bruising and lower extremity swelling in both legs.  Concerned about lymphedema.  Taking Tumeric.   Had her breast implants removed in October in [place].   Now taking Pristiq through her provider in [place].   Likes her Paragard IUD.  Did not like Mirena IUD in past.   PCP:   Carilyn Goodpasture, NP - [facility]   Patient's last menstrual period was 11/26/2022 (exact date).     Period Cycle (Days): 28 Period Duration (Days): 4-5 Period Pattern: Regular Menstrual Flow: Moderate Menstrual Control: Tampon Dysmenorrhea: (!) Mild     Sexually active: Yes.    The current method of family planning is IUD.   Paragard 05/2019. Exercising: No.     Smoker:  no  Health Maintenance: Pap:  05/2022 normal in [place], 12/01/21 neg: HR HPV neg, 07/2017-WNL per pt  History of abnormal Pap:  no MMG:  03/26/22 - Bi-RADS 1, cat b density Colonoscopy:  n/a BMD:   n/a  Result  n/a TDaP:  07/12/18 Gardasil:   yes. HIV: 09/23/18 NR Hep C: n/a Screening Labs:  PCP   reports that she has never smoked. She has never used smokeless tobacco. She reports that she does not currently use alcohol. She reports that she does not use drugs.  Past Medical History:  Diagnosis Date   AMA (advanced maternal age) primigravida 35+, unspecified trimester    Anemia    Anxiety    B12 deficiency    Back pain    Bilateral ovarian cysts    Chronic constipation    Constipation    Depression    Edema of both lower extremities    Endometriosis    Fatigue    Hx of breast implants, bilateral 2007   In [place], Silicone implants   Palpitations    SOB (shortness of breath) on exertion    Vitamin D deficiency     Past Surgical History:  Procedure Laterality Date   AUGMENTATION MAMMAPLASTY     Silicone implants--[place]   BREAST IMPLANT REMOVAL Bilateral    CESAREAN SECTION N/A 04/11/2019    Procedure: CESAREAN SECTION;  Surgeon: Shea Evans, MD;  Location: [facility];  Service: Obstetrics;  Laterality: N/A;    Current Outpatient Medications  Medication Sig Dispense Refill   Desvenlafaxine ER (PRISTIQ) 50 MG TB24      paragard intrauterine copper IUD IUD 1 each by Intrauterine route once.     Prenatal Vit-Fe Fumarate-FA (PRENATAL VITAMINS) 28-0.8 MG TABS Take 1 tablet by mouth daily. 90 tablet 0   Vitamin D, Ergocalciferol, (DRISDOL) 1.25 MG (50000 UNIT) CAPS capsule Take 1 capsule (50,000 Units total) by mouth every 7 (seven) days. 4 capsule 0   ibuprofen (ADVIL) 800 MG tablet Take 1 tablet (800 mg total) by mouth every 8 (eight) hours as needed. 30 tablet 3   No current facility-administered medications for this visit.    Family History  Problem Relation Age of Onset   Thyroid disease Mother    Hypertension Mother    Bipolar disorder Mother    Obesity Mother    Diabetes Maternal Grandfather    Breast cancer Paternal Grandmother    Cancer Paternal Grandmother     Review of Systems  All other systems reviewed and are negative.   Exam:   BP 126/80 (BP Location: Left Arm, Patient Position: Sitting, Cuff Size:  Normal)   Pulse 80   Ht 5' 5.5" (1.664 m)   Wt 186 lb (84.4 kg)   LMP 11/26/2022 (Exact Date)   SpO2 98%   BMI 30.48 kg/m     General appearance: alert, cooperative and appears stated age Head: normocephalic, without obvious abnormality, atraumatic Neck: no adenopathy, supple, symmetrical, trachea midline and thyroid normal to inspection and palpation Lungs: clear to auscultation bilaterally Breasts: normal appearance, no masses or tenderness, No nipple retraction or dimpling, No nipple discharge or bleeding, No axillary adenopathy Heart: regular rate and rhythm Abdomen: soft, non-tender; no masses, no organomegaly Extremities: extremities normal, atraumatic, no cyanosis or edema Skin: skin color, texture, turgor normal. No rashes or lesions Lymph  nodes: cervical, supraclavicular, and axillary nodes normal. Neurologic: grossly normal  Pelvic: External genitalia:  no lesions              No abnormal inguinal nodes palpated.              Urethra:  normal appearing urethra with no masses, tenderness or lesions              Bartholins and Skenes: normal                 Vagina: normal appearing vagina with normal color and discharge, no lesions              Cervix: no lesions              Pap taken: no Bimanual Exam:  Uterus:  normal size, contour, position, consistency, mobility, non-tender              Adnexa: no mass, fullness, tenderness              Rectal exam: yes.  Confirms.              Anus:  normal sphincter tone, no lesions  Chaperone was present for exam:  Irving Burton, CMA  Assessment:   Well woman visit with gynecologic exam. Paragard IUD.  Hx vulvar condyloma.  Status post removal of bilateral breast augmentation.  Hx PP depression and anxiety.  Plan: Mammogram screening discussed. Self breast awareness reviewed. Pap and HR HPV as above. Guidelines for Calcium, Vitamin D, regular exercise program including cardiovascular and weight bearing exercise.   Follow up annually and prn.    After visit summary provided.

## 2022-12-03 ENCOUNTER — Ambulatory Visit: Payer: BC Managed Care – PPO | Admitting: Obstetrics and Gynecology

## 2022-12-09 ENCOUNTER — Encounter: Payer: Self-pay | Admitting: Obstetrics and Gynecology

## 2022-12-09 ENCOUNTER — Ambulatory Visit (INDEPENDENT_AMBULATORY_CARE_PROVIDER_SITE_OTHER): Payer: BC Managed Care – PPO | Admitting: Obstetrics and Gynecology

## 2022-12-09 VITALS — BP 126/80 | HR 80 | Ht 65.5 in | Wt 186.0 lb

## 2022-12-09 DIAGNOSIS — Z01419 Encounter for gynecological examination (general) (routine) without abnormal findings: Secondary | ICD-10-CM | POA: Diagnosis not present

## 2022-12-09 DIAGNOSIS — Z23 Encounter for immunization: Secondary | ICD-10-CM

## 2022-12-09 MED ORDER — IBUPROFEN 800 MG PO TABS: 800.0000 mg | ORAL_TABLET | Freq: Three times a day (TID) | ORAL | 3 refills | Status: DC | PRN

## 2022-12-10 NOTE — Patient Instructions (Signed)

## 2022-12-27 DIAGNOSIS — R300 Dysuria: Secondary | ICD-10-CM | POA: Diagnosis not present

## 2022-12-27 DIAGNOSIS — N390 Urinary tract infection, site not specified: Secondary | ICD-10-CM | POA: Diagnosis not present

## 2023-01-29 DIAGNOSIS — F411 Generalized anxiety disorder: Secondary | ICD-10-CM | POA: Diagnosis not present

## 2023-01-29 DIAGNOSIS — F321 Major depressive disorder, single episode, moderate: Secondary | ICD-10-CM | POA: Diagnosis not present

## 2023-04-08 DIAGNOSIS — D485 Neoplasm of uncertain behavior of skin: Secondary | ICD-10-CM | POA: Diagnosis not present

## 2023-04-08 DIAGNOSIS — D225 Melanocytic nevi of trunk: Secondary | ICD-10-CM | POA: Diagnosis not present

## 2023-04-08 DIAGNOSIS — B078 Other viral warts: Secondary | ICD-10-CM | POA: Diagnosis not present

## 2023-04-15 DIAGNOSIS — R350 Frequency of micturition: Secondary | ICD-10-CM | POA: Diagnosis not present

## 2023-04-15 DIAGNOSIS — R300 Dysuria: Secondary | ICD-10-CM | POA: Diagnosis not present

## 2023-05-03 DIAGNOSIS — R5383 Other fatigue: Secondary | ICD-10-CM | POA: Diagnosis not present

## 2023-05-03 DIAGNOSIS — E559 Vitamin D deficiency, unspecified: Secondary | ICD-10-CM | POA: Diagnosis not present

## 2023-05-03 DIAGNOSIS — D519 Vitamin B12 deficiency anemia, unspecified: Secondary | ICD-10-CM | POA: Diagnosis not present

## 2023-05-03 DIAGNOSIS — F419 Anxiety disorder, unspecified: Secondary | ICD-10-CM | POA: Diagnosis not present

## 2023-05-18 DIAGNOSIS — Z131 Encounter for screening for diabetes mellitus: Secondary | ICD-10-CM | POA: Diagnosis not present

## 2023-05-18 DIAGNOSIS — R5383 Other fatigue: Secondary | ICD-10-CM | POA: Diagnosis not present

## 2023-05-18 DIAGNOSIS — D519 Vitamin B12 deficiency anemia, unspecified: Secondary | ICD-10-CM | POA: Diagnosis not present

## 2023-05-18 DIAGNOSIS — E611 Iron deficiency: Secondary | ICD-10-CM | POA: Diagnosis not present

## 2023-05-18 DIAGNOSIS — F419 Anxiety disorder, unspecified: Secondary | ICD-10-CM | POA: Diagnosis not present

## 2023-05-18 DIAGNOSIS — E559 Vitamin D deficiency, unspecified: Secondary | ICD-10-CM | POA: Diagnosis not present

## 2023-07-05 DIAGNOSIS — G8929 Other chronic pain: Secondary | ICD-10-CM | POA: Diagnosis not present

## 2023-07-05 DIAGNOSIS — E669 Obesity, unspecified: Secondary | ICD-10-CM | POA: Diagnosis not present

## 2023-07-05 DIAGNOSIS — M25561 Pain in right knee: Secondary | ICD-10-CM | POA: Diagnosis not present

## 2023-07-05 DIAGNOSIS — F419 Anxiety disorder, unspecified: Secondary | ICD-10-CM | POA: Diagnosis not present

## 2023-07-23 DIAGNOSIS — E559 Vitamin D deficiency, unspecified: Secondary | ICD-10-CM | POA: Diagnosis not present

## 2023-07-23 DIAGNOSIS — R5383 Other fatigue: Secondary | ICD-10-CM | POA: Diagnosis not present

## 2023-07-23 DIAGNOSIS — D519 Vitamin B12 deficiency anemia, unspecified: Secondary | ICD-10-CM | POA: Diagnosis not present

## 2023-07-23 DIAGNOSIS — F419 Anxiety disorder, unspecified: Secondary | ICD-10-CM | POA: Diagnosis not present

## 2023-07-28 DIAGNOSIS — L918 Other hypertrophic disorders of the skin: Secondary | ICD-10-CM | POA: Diagnosis not present

## 2023-07-28 DIAGNOSIS — D485 Neoplasm of uncertain behavior of skin: Secondary | ICD-10-CM | POA: Diagnosis not present

## 2023-07-28 DIAGNOSIS — L820 Inflamed seborrheic keratosis: Secondary | ICD-10-CM | POA: Diagnosis not present

## 2023-07-28 DIAGNOSIS — L218 Other seborrheic dermatitis: Secondary | ICD-10-CM | POA: Diagnosis not present

## 2024-01-25 DIAGNOSIS — F419 Anxiety disorder, unspecified: Secondary | ICD-10-CM | POA: Diagnosis not present

## 2024-01-25 DIAGNOSIS — D519 Vitamin B12 deficiency anemia, unspecified: Secondary | ICD-10-CM | POA: Diagnosis not present

## 2024-01-25 DIAGNOSIS — Z131 Encounter for screening for diabetes mellitus: Secondary | ICD-10-CM | POA: Diagnosis not present

## 2024-01-25 DIAGNOSIS — E611 Iron deficiency: Secondary | ICD-10-CM | POA: Diagnosis not present

## 2024-01-25 DIAGNOSIS — E559 Vitamin D deficiency, unspecified: Secondary | ICD-10-CM | POA: Diagnosis not present

## 2024-01-25 DIAGNOSIS — R5383 Other fatigue: Secondary | ICD-10-CM | POA: Diagnosis not present

## 2024-01-31 ENCOUNTER — Encounter: Payer: Self-pay | Admitting: Obstetrics and Gynecology

## 2024-01-31 ENCOUNTER — Ambulatory Visit (INDEPENDENT_AMBULATORY_CARE_PROVIDER_SITE_OTHER): Admitting: Obstetrics and Gynecology

## 2024-01-31 ENCOUNTER — Other Ambulatory Visit (HOSPITAL_COMMUNITY)
Admission: RE | Admit: 2024-01-31 | Discharge: 2024-01-31 | Disposition: A | Discharge status: Home / Self Care | Source: Ambulatory Visit | Attending: Obstetrics and Gynecology | Admitting: Obstetrics and Gynecology

## 2024-01-31 VITALS — BP 112/76 | HR 85

## 2024-01-31 DIAGNOSIS — N6325 Unspecified lump in the left breast, overlapping quadrants: Secondary | ICD-10-CM | POA: Diagnosis not present

## 2024-01-31 DIAGNOSIS — N898 Other specified noninflammatory disorders of vagina: Secondary | ICD-10-CM

## 2024-01-31 DIAGNOSIS — N6315 Unspecified lump in the right breast, overlapping quadrants: Secondary | ICD-10-CM | POA: Diagnosis not present

## 2024-01-31 DIAGNOSIS — N920 Excessive and frequent menstruation with regular cycle: Secondary | ICD-10-CM | POA: Diagnosis not present

## 2024-01-31 DIAGNOSIS — M6208 Separation of muscle (nontraumatic), other site: Secondary | ICD-10-CM | POA: Diagnosis not present

## 2024-01-31 DIAGNOSIS — N946 Dysmenorrhea, unspecified: Secondary | ICD-10-CM | POA: Diagnosis not present

## 2024-01-31 DIAGNOSIS — Z87440 Personal history of urinary (tract) infections: Secondary | ICD-10-CM

## 2024-01-31 NOTE — Progress Notes (Unsigned)
 GYNECOLOGY  VISIT   HPI: 43 y.o.   Married  [place] female   [phone] with Patient's last menstrual period was 01/16/2024 (exact date).   here for: Umbilical hernia - has had it for about 5 months. Causing her pain.   [place] interpretor is present today.   Patient has multiple concerns.   In [place], she did an US  showing diastasis rectii, 4.2 cm with hernia of fatty tissue in the area.  She has pain.   Having bladder infections prior to menstruation.   She is doing home tests, and she states she has blood in her urine.  Her provider in [place] recommended urine cytology.    She received an Rx for Macrobid 100 mg daily and cranberry 200 mg daily.    She has urinary frequency and incontinence.  Has heavy menses.  Pain with periods and PMS.     She is also perceiving vaginal odor.   She had her breast implants removed 1.5 years ago, and she notes a left breast mass.    GYNECOLOGIC HISTORY: Patient's last menstrual period was 01/16/2024 (exact date). Contraception:  IUD, Paragard  05/2019. Menopausal hormone therapy:  n/a Last 2 paps:  12/01/21 neg HR HPV neg  History of abnormal Pap or positive HPV:  no Mammogram:  03/26/22 Breast Density Cat B, BIRADS Cat 1 neg         OB History     Gravida  1   Para  1   Term  1   Preterm  0   AB  0   Living  1      SAB  0   IAB  0   Ectopic  0   Multiple  0   Live Births  1              Patient Active Problem List   Diagnosis Date Noted   Postpartum care following cesarean delivery (9/22) 04/12/2019   Encounter for induction of labor 04/11/2019   Delivery by emergency cesarean 04/11/2019   Bilateral ovarian cysts 12/12/2017    Past Medical History:  Diagnosis Date   AMA (advanced maternal age) primigravida 35+, unspecified trimester    Anemia    Anxiety    B12 deficiency    Back pain    Bilateral ovarian cysts    Chronic constipation    Constipation    Depression    Edema of both lower  extremities    Endometriosis    Fatigue    Hx of breast implants, bilateral 2007   In [place], Silicone implants   Palpitations    SOB (shortness of breath) on exertion    Vitamin D  deficiency     Past Surgical History:  Procedure Laterality Date   AUGMENTATION MAMMAPLASTY     Silicone implants--[place]   BREAST IMPLANT REMOVAL Bilateral    CESAREAN SECTION N/A 04/11/2019   Procedure: CESAREAN SECTION;  Surgeon: Barbette Knock, MD;  Location: [facility];  Service: Obstetrics;  Laterality: N/A;    Current Outpatient Medications  Medication Sig Dispense Refill   Desvenlafaxine ER (PRISTIQ) 50 MG TB24      paragard  intrauterine copper  IUD IUD 1 each by Intrauterine route once.     No current facility-administered medications for this visit.     ALLERGIES: Prozac [fluoxetine]  Family History  Problem Relation Age of Onset   Thyroid  disease Mother    Hypertension Mother    Bipolar disorder Mother    Obesity Mother  Diabetes Maternal Grandfather    Breast cancer Paternal Grandmother    Cancer Paternal Grandmother     Social History   Socioeconomic History   Marital status: Married    Spouse name: Hassie   Number of children: 1   Years of education: Not on file   Highest education level: Not on file  Occupational History   Occupation: Stay at home Spouse  Tobacco Use   Smoking status: Never   Smokeless tobacco: Never  Vaping Use   Vaping status: Never Used  Substance and Sexual Activity   Alcohol use: Not Currently   Drug use: No   Sexual activity: Yes    Partners: Male    Birth control/protection: I.U.D.    Comment: Paragard  05/2019  Other Topics Concern   Not on file  Social History Narrative   Not on file   Social Drivers of Health   Financial Resource Strain: Low Risk  (03/31/2019)   Overall Financial Resource Strain (CARDIA)    Difficulty of Paying Living Expenses: Not hard at all  Food Insecurity: No Food Insecurity (03/31/2019)   Hunger Vital  Sign    Worried About Running Out of Food in the Last Year: Never true    Ran Out of Food in the Last Year: Never true  Transportation Needs: No Transportation Needs (03/31/2019)   PRAPARE - Administrator, Civil Service (Medical): No    Lack of Transportation (Non-Medical): No  Physical Activity: Not on file  Stress: Not on file  Social Connections: Not on file  Intimate Partner Violence: Not on file    Review of Systems  See HPI.   PHYSICAL EXAMINATION:   BP 112/76 (BP Location: Left Arm, Patient Position: Sitting)   Pulse 85   LMP 01/16/2024 (Exact Date)   SpO2 97%     General appearance: alert, cooperative and appears stated age Head: Normocephalic, without obvious abnormality, atraumatic Breasts: right - consistent with reduction, 1 cm mass at 6:00, no tenderness, No nipple retraction or dimpling, No nipple discharge or bleeding, No axillary or supraclavicular adenopathy Left - consistent with reduction, 1 cm mass at 6:00, no tenderness, No nipple retraction or dimpling, No nipple discharge or bleeding, No axillary or supraclavicular adenopathy Abdomen: soft, non-tender, diastasis rectii noted.  No abnormal inguinal nodes palpated Neurologic: Grossly normal  Pelvic: External genitalia:  no lesions              Urethra:  normal appearing urethra with no masses, tenderness or lesions              Bartholins and Skenes: normal                 Vagina: normal appearing vagina with normal color and discharge, no lesions              Cervix: no lesions.  IUD strings noted.                 Bimanual Exam:  Uterus:  normal size, contour, position, consistency, mobility, non-tender              Adnexa: no mass, fullness, tenderness          Chaperone was present for exam:  Kari HERO, CMA  ASSESSMENT:  Diastasis rectii. Bilateral breast masses at 6:00.   Status post bilateral implant removals. Menorrhagia with regular cycles.  Dysmenorrhea. Paragard  IUD.  Vaginal odor.   Hx UTIs.    PLAN:  Referral to [facility]  Surgery:  Dr. Vernetta. Will plan for bilateral dx mammog and [facility].   Return for pelvic US .  Nuswab for vaginitis testing.  Patient prefers oral therapy if needed. Referral to urology.   49 min  total time was spent for this patient encounter, including preparation, face-to-face counseling with the patient, coordination of care, and documentation of the encounter.

## 2024-02-01 ENCOUNTER — Telehealth: Payer: Self-pay | Admitting: Obstetrics and Gynecology

## 2024-02-01 DIAGNOSIS — N6315 Unspecified lump in the right breast, overlapping quadrants: Secondary | ICD-10-CM

## 2024-02-01 DIAGNOSIS — N6325 Unspecified lump in the left breast, overlapping quadrants: Secondary | ICD-10-CM

## 2024-02-01 NOTE — Telephone Encounter (Signed)
 Please schedule bilateral dx mammogram and bilateral breast US  at [facility].   My patient has bilateral breast masses.  Right breast 1 cm mass at 6:00.  Left breast 1 cm mass at 6:00.   She had breast implants and then had removal of them.

## 2024-02-02 ENCOUNTER — Ambulatory Visit: Payer: Self-pay | Admitting: Obstetrics and Gynecology

## 2024-02-02 LAB — CERVICOVAGINAL ANCILLARY ONLY
Bacterial Vaginitis (gardnerella): POSITIVE — AB
Candida Glabrata: NEGATIVE
Candida Vaginitis: POSITIVE — AB
Comment: NEGATIVE
Comment: NEGATIVE
Comment: NEGATIVE
Comment: NEGATIVE
Trichomonas: NEGATIVE

## 2024-02-02 MED ORDER — METRONIDAZOLE 500 MG PO TABS: 500.0000 mg | ORAL_TABLET | Freq: Two times a day (BID) | ORAL | 0 refills | Status: AC

## 2024-02-02 MED ORDER — FLUCONAZOLE 150 MG PO TABS: 150.0000 mg | ORAL_TABLET | Freq: Once | ORAL | 0 refills | Status: AC

## 2024-02-02 NOTE — Telephone Encounter (Signed)
 Spoke with [employer] at [facility]. Patient scheduled for 02/24/24 at 1430.   Patient notified and verbalizes understanding.  Encounter closed.

## 2024-02-21 ENCOUNTER — Other Ambulatory Visit: Payer: Self-pay | Admitting: *Deleted

## 2024-02-21 DIAGNOSIS — M6208 Separation of muscle (nontraumatic), other site: Secondary | ICD-10-CM

## 2024-02-24 ENCOUNTER — Ambulatory Visit
Admission: RE | Admit: 2024-02-24 | Discharge: 2024-02-24 | Disposition: A | Discharge status: Home / Self Care | Source: Ambulatory Visit | Attending: Obstetrics and Gynecology | Admitting: Obstetrics and Gynecology

## 2024-02-24 DIAGNOSIS — N6315 Unspecified lump in the right breast, overlapping quadrants: Secondary | ICD-10-CM

## 2024-02-24 DIAGNOSIS — R928 Other abnormal and inconclusive findings on diagnostic imaging of breast: Secondary | ICD-10-CM | POA: Diagnosis not present

## 2024-02-24 DIAGNOSIS — N6325 Unspecified lump in the left breast, overlapping quadrants: Secondary | ICD-10-CM

## 2024-02-24 DIAGNOSIS — R92323 Mammographic fibroglandular density, bilateral breasts: Secondary | ICD-10-CM | POA: Diagnosis not present

## 2024-02-24 DIAGNOSIS — N6489 Other specified disorders of breast: Secondary | ICD-10-CM | POA: Diagnosis not present

## 2024-02-26 ENCOUNTER — Ambulatory Visit: Payer: Self-pay | Admitting: Obstetrics and Gynecology

## 2024-03-08 ENCOUNTER — Encounter: Payer: Self-pay | Admitting: Obstetrics and Gynecology

## 2024-03-08 ENCOUNTER — Ambulatory Visit (INDEPENDENT_AMBULATORY_CARE_PROVIDER_SITE_OTHER): Admitting: Obstetrics and Gynecology

## 2024-03-08 ENCOUNTER — Ambulatory Visit

## 2024-03-08 VITALS — BP 124/82 | HR 78

## 2024-03-08 DIAGNOSIS — N946 Dysmenorrhea, unspecified: Secondary | ICD-10-CM | POA: Diagnosis not present

## 2024-03-08 DIAGNOSIS — N920 Excessive and frequent menstruation with regular cycle: Secondary | ICD-10-CM | POA: Diagnosis not present

## 2024-03-08 DIAGNOSIS — Z30431 Encounter for routine checking of intrauterine contraceptive device: Secondary | ICD-10-CM

## 2024-03-08 MED ORDER — LEVONORGEST-ETH ESTRAD 91-DAY 0.15-0.03 MG PO TABS: 1.0000 | ORAL_TABLET | Freq: Every day | ORAL | 1 refills | Status: AC

## 2024-03-08 NOTE — Progress Notes (Unsigned)
 GYNECOLOGY  VISIT   HPI: 43 y.o.   Married  [place] female   [phone] with Patient's last menstrual period was 03/01/2024 (exact date).   here for: US  consult  for menorrhagia and dysmenorrhea.  Has Paragard  IUD.   Notes PMS.    Had removal of prior Mirena IUD and had significant bleeding.   Hx history of heavy menses.    Does not plan future pregnancy.   Denies HTN, liver or breast disease, smoking, migraine with aura, personal history of thromboembolic events.    Used birth control pills in the past without problems.  GYNECOLOGIC HISTORY: Patient's last menstrual period was 03/01/2024 (exact date). Contraception:  IUD - Paragard  05/2019  Menopausal hormone therapy:  n/a Last 2 paps:  12/01/21 neg HR HPV neg  History of abnormal Pap or positive HPV:  no Mammogram:  02/24/24 Breast Density Cat B, BIRADS Cat 2 benign         OB History     Gravida  1   Para  1   Term  1   Preterm  0   AB  0   Living  1      SAB  0   IAB  0   Ectopic  0   Multiple  0   Live Births  1              Patient Active Problem List   Diagnosis Date Noted   Postpartum care following cesarean delivery (9/22) 04/12/2019   Encounter for induction of labor 04/11/2019   Delivery by emergency cesarean 04/11/2019   Bilateral ovarian cysts 12/12/2017    Past Medical History:  Diagnosis Date   AMA (advanced maternal age) primigravida 35+, unspecified trimester    Anemia    Anxiety    B12 deficiency    Back pain    Bilateral ovarian cysts    Chronic constipation    Constipation    Depression    Edema of both lower extremities    Endometriosis    Fatigue    Hx of breast implants, bilateral 2007   In [place], Silicone implants   Palpitations    SOB (shortness of breath) on exertion    Vitamin D  deficiency     Past Surgical History:  Procedure Laterality Date   AUGMENTATION MAMMAPLASTY     implants removed 2023   BREAST IMPLANT REMOVAL Bilateral    CESAREAN  SECTION N/A 04/11/2019   Procedure: CESAREAN SECTION;  Surgeon: Barbette Knock, MD;  Location: [facility];  Service: Obstetrics;  Laterality: N/A;   REDUCTION MAMMAPLASTY      Current Outpatient Medications  Medication Sig Dispense Refill   Desvenlafaxine ER (PRISTIQ) 50 MG TB24      levonorgestrel-ethinyl estradiol  (SEASONALE) 0.15-0.03 MG tablet Take 1 tablet by mouth daily. 91 tablet 1   paragard  intrauterine copper  IUD IUD 1 each by Intrauterine route once.     ZEPBOUND 2.5 MG/0.5ML Pen SMARTSIG:2.5 Milligram(s) Once a Week     metroNIDAZOLE  (FLAGYL ) 500 MG tablet Take 1 tablet (500 mg total) by mouth 2 (two) times daily. Take twice daily for one week. (Patient not taking: Reported on 03/08/2024) 14 tablet 0   No current facility-administered medications for this visit.     ALLERGIES: Prozac [fluoxetine]  Family History  Problem Relation Age of Onset   Thyroid  disease Mother    Hypertension Mother    Bipolar disorder Mother    Obesity Mother    Diabetes Maternal Grandfather  Breast cancer Paternal Grandmother    Cancer Paternal Grandmother     Social History   Socioeconomic History   Marital status: Married    Spouse name: Hassie   Number of children: 1   Years of education: Not on file   Highest education level: Not on file  Occupational History   Occupation: Stay at home Spouse  Tobacco Use   Smoking status: Never   Smokeless tobacco: Never  Vaping Use   Vaping status: Never Used  Substance and Sexual Activity   Alcohol use: Not Currently   Drug use: No   Sexual activity: Yes    Partners: Male    Birth control/protection: I.U.D.    Comment: Paragard  05/2019  Other Topics Concern   Not on file  Social History Narrative   Not on file   Social Drivers of Health   Financial Resource Strain: Low Risk  (03/31/2019)   Overall Financial Resource Strain (CARDIA)    Difficulty of Paying Living Expenses: Not hard at all  Food Insecurity: No Food Insecurity  (03/31/2019)   Hunger Vital Sign    Worried About Running Out of Food in the Last Year: Never true    Ran Out of Food in the Last Year: Never true  Transportation Needs: No Transportation Needs (03/31/2019)   PRAPARE - Administrator, Civil Service (Medical): No    Lack of Transportation (Non-Medical): No  Physical Activity: Not on file  Stress: Not on file  Social Connections: Not on file  Intimate Partner Violence: Not on file    Review of Systems  See HPI.  PHYSICAL EXAMINATION:   BP 124/82 (BP Location: Left Arm, Patient Position: Sitting)   Pulse 78   LMP 03/01/2024 (Exact Date)   SpO2 100%     General appearance: alert, cooperative and appears stated age   Pelvic US   Uterus 8.02 x 5.3 x 4.58 cm.  No myometrial masses. EMS 5.42 mm.  IUD is noted centrally in the endometrial canal.  Left ovary 3.67 x 1.66 x 1.93 cm.  Normal follicles.  Normal perfusion. Right ovary 2.67 x 1.55 x 1.84 cm.  Normal follicles.  Normal perfusion. No adnexal masses.  No free fluid.   ASSESSMENT:  Menorrhagia with regular menses.  Dysmenorrhea.  IUD check up.   PLAN:  US  images and report reviewed.  Rx Seasonale. Instructed in use. I discussed warning signs and risk of stroke, MI, DVT, and PE.  This is expected to be low as the patient has tolerated combined oral contraception in the past.  Will consider switching to Mirena if the Seasonale does not work well.  FU for annual exam and prn.  30 min  total time was spent for this patient encounter, including preparation, face-to-face counseling with the patient, coordination of care, and documentation of the encounter.

## 2024-03-10 DIAGNOSIS — N3946 Mixed incontinence: Secondary | ICD-10-CM | POA: Diagnosis not present

## 2024-03-10 DIAGNOSIS — R8271 Bacteriuria: Secondary | ICD-10-CM | POA: Diagnosis not present

## 2024-03-13 DIAGNOSIS — N3946 Mixed incontinence: Secondary | ICD-10-CM | POA: Diagnosis not present

## 2024-03-13 DIAGNOSIS — N3021 Other chronic cystitis with hematuria: Secondary | ICD-10-CM | POA: Diagnosis not present

## 2024-03-21 DIAGNOSIS — M6281 Muscle weakness (generalized): Secondary | ICD-10-CM | POA: Diagnosis not present

## 2024-03-21 DIAGNOSIS — M6289 Other specified disorders of muscle: Secondary | ICD-10-CM | POA: Diagnosis not present

## 2024-03-21 DIAGNOSIS — N3946 Mixed incontinence: Secondary | ICD-10-CM | POA: Diagnosis not present

## 2024-05-22 DIAGNOSIS — M6281 Muscle weakness (generalized): Secondary | ICD-10-CM | POA: Diagnosis not present

## 2024-05-22 DIAGNOSIS — M6289 Other specified disorders of muscle: Secondary | ICD-10-CM | POA: Diagnosis not present

## 2024-05-22 DIAGNOSIS — M62838 Other muscle spasm: Secondary | ICD-10-CM | POA: Diagnosis not present

## 2024-05-22 DIAGNOSIS — N3941 Urge incontinence: Secondary | ICD-10-CM | POA: Diagnosis not present

## 2024-06-02 DIAGNOSIS — N3941 Urge incontinence: Secondary | ICD-10-CM | POA: Diagnosis not present

## 2024-06-02 DIAGNOSIS — M62838 Other muscle spasm: Secondary | ICD-10-CM | POA: Diagnosis not present

## 2024-06-02 DIAGNOSIS — M6289 Other specified disorders of muscle: Secondary | ICD-10-CM | POA: Diagnosis not present

## 2024-06-02 DIAGNOSIS — M6281 Muscle weakness (generalized): Secondary | ICD-10-CM | POA: Diagnosis not present

## 2024-06-30 DIAGNOSIS — M6289 Other specified disorders of muscle: Secondary | ICD-10-CM | POA: Diagnosis not present

## 2024-06-30 DIAGNOSIS — M6281 Muscle weakness (generalized): Secondary | ICD-10-CM | POA: Diagnosis not present

## 2024-06-30 DIAGNOSIS — M62838 Other muscle spasm: Secondary | ICD-10-CM | POA: Diagnosis not present

## 2024-06-30 DIAGNOSIS — N3941 Urge incontinence: Secondary | ICD-10-CM | POA: Diagnosis not present

## 2024-10-11 ENCOUNTER — Ambulatory Visit: Admitting: Obstetrics and Gynecology
